# Patient Record
Sex: Female | Born: 2000 | Race: White | Hispanic: No | Marital: Single | Smoking: Former smoker
Health system: Southern US, Community
[De-identification: ages and names within clinical notes are randomized; demographics above are authoritative.]

## PROBLEM LIST (undated history)

## (undated) DIAGNOSIS — F419 Anxiety disorder, unspecified: Secondary | ICD-10-CM

## (undated) HISTORY — PX: BACK SURGERY: SHX140

## (undated) HISTORY — PX: HIP SURGERY: SHX245

## (undated) HISTORY — PX: HAND SURGERY: SHX662

## (undated) HISTORY — PX: JOINT REPLACEMENT: SHX530

## (undated) HISTORY — PX: TONSILLECTOMY: SUR1361

---
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
## 2018-03-23 ENCOUNTER — Other Ambulatory Visit: Payer: Self-pay

## 2018-03-23 ENCOUNTER — Emergency Department
Admission: EM | Admit: 2018-03-23 | Discharge: 2018-03-23 | Disposition: A | Discharge status: Home / Self Care | Payer: BLUE CROSS/BLUE SHIELD | Attending: Emergency Medicine | Admitting: Emergency Medicine

## 2018-03-23 ENCOUNTER — Emergency Department: Payer: BLUE CROSS/BLUE SHIELD

## 2018-03-23 DIAGNOSIS — B349 Viral infection, unspecified: Secondary | ICD-10-CM | POA: Diagnosis not present

## 2018-03-23 DIAGNOSIS — Z79899 Other long term (current) drug therapy: Secondary | ICD-10-CM | POA: Diagnosis not present

## 2018-03-23 DIAGNOSIS — J029 Acute pharyngitis, unspecified: Secondary | ICD-10-CM

## 2018-03-23 DIAGNOSIS — J028 Acute pharyngitis due to other specified organisms: Secondary | ICD-10-CM | POA: Insufficient documentation

## 2018-03-23 DIAGNOSIS — F17210 Nicotine dependence, cigarettes, uncomplicated: Secondary | ICD-10-CM | POA: Insufficient documentation

## 2018-03-23 DIAGNOSIS — B9789 Other viral agents as the cause of diseases classified elsewhere: Secondary | ICD-10-CM | POA: Diagnosis not present

## 2018-03-23 LAB — BASIC METABOLIC PANEL
Anion gap: 8 (ref 5–15)
BUN: 8 mg/dL (ref 6–20)
CHLORIDE: 106 mmol/L (ref 98–111)
CO2: 22 mmol/L (ref 22–32)
CREATININE: 0.76 mg/dL (ref 0.44–1.00)
Calcium: 9.1 mg/dL (ref 8.9–10.3)
GFR calc Af Amer: >60 mL/min (ref >60)
GFR calc non Af Amer: >60 mL/min (ref >60)
Glucose, Bld: 115 mg/dL — ABNORMAL HIGH (ref 70–99)
Potassium: 3.7 mmol/L (ref 3.5–5.1)
Sodium: 136 mmol/L (ref 135–145)

## 2018-03-23 LAB — CBC WITH DIFFERENTIAL/PLATELET
Abs Immature Granulocytes: 0.08 10*3/uL — ABNORMAL HIGH (ref 0.00–0.07)
Basophils Absolute: 0 10*3/uL (ref 0.0–0.1)
Basophils Relative: 0 %
EOS ABS: 0.1 10*3/uL (ref 0.0–0.5)
EOS PCT: 1 %
HEMATOCRIT: 37.5 % (ref 36.0–46.0)
HEMOGLOBIN: 12.2 g/dL (ref 12.0–15.0)
Immature Granulocytes: 1 %
LYMPHS ABS: 1.2 10*3/uL (ref 0.7–4.0)
Lymphocytes Relative: 9 %
MCH: 27.2 pg (ref 26.0–34.0)
MCHC: 32.5 g/dL (ref 30.0–36.0)
MCV: 83.7 fL (ref 80.0–100.0)
MONO ABS: 0.8 10*3/uL (ref 0.1–1.0)
Monocytes Relative: 6 %
Neutro Abs: 11.4 10*3/uL — ABNORMAL HIGH (ref 1.7–7.7)
Neutrophils Relative %: 83 %
Platelets: 336 10*3/uL (ref 150–400)
RBC: 4.48 MIL/uL (ref 3.87–5.11)
RDW: 13.2 % (ref 11.5–15.5)
WBC: 13.5 10*3/uL — AB (ref 4.0–10.5)
nRBC: 0 % (ref 0.0–0.2)

## 2018-03-23 LAB — INFLUENZA PANEL BY PCR (TYPE A & B)
INFLAPCR: NEGATIVE
INFLBPCR: NEGATIVE

## 2018-03-23 LAB — GROUP A STREP BY PCR: Group A Strep by PCR: NOT DETECTED

## 2018-03-23 LAB — PROCALCITONIN: Procalcitonin: 0.12 ng/mL

## 2018-03-23 LAB — LACTIC ACID, PLASMA: Lactic Acid, Venous: 1.4 mmol/L (ref 0.5–1.9)

## 2018-03-23 MED ORDER — SODIUM CHLORIDE 0.9 % IV BOLUS
1000.0000 mL | Freq: Once | INTRAVENOUS | Status: AC
  Administered 2018-03-23: 1000 mL via INTRAVENOUS

## 2018-03-23 MED ORDER — DEXAMETHASONE SODIUM PHOSPHATE 10 MG/ML IJ SOLN
10.0000 mg | Freq: Once | INTRAMUSCULAR | Status: AC
  Administered 2018-03-23: 10 mg via INTRAVENOUS
  Filled 2018-03-23: qty 1

## 2018-03-23 MED ORDER — IBUPROFEN 800 MG PO TABS
ORAL_TABLET | ORAL | Status: AC
  Filled 2018-03-23: qty 1

## 2018-03-23 MED ORDER — IBUPROFEN 800 MG PO TABS
800.0000 mg | ORAL_TABLET | Freq: Once | ORAL | Status: AC
  Administered 2018-03-23: 800 mg via ORAL

## 2018-03-23 NOTE — ED Notes (Signed)
Patient transported to X-ray 

## 2018-03-23 NOTE — ED Provider Notes (Signed)
[facility] Emergency Department Provider Note  ____________________________________________   First MD Initiated Contact with Patient 03/23/18 [phone]     (approximate)  I have reviewed the triage vital signs and the nursing notes.   HISTORY  Chief Complaint Sore Throat    HPI Tara Daugherty is a 18 y.o. female with no chronic medical issues and is currently a Consulting civil engineer at [employer].  She presents for evaluation of 24 to 48 hours of gradually worsening sore throat, mild headache, generalized aches throughout her body, subjective fever and chills, and some nausea.  She has had one episode of vomiting.  She has had less appetite than usual but no abdominal pain.  The symptom that is bothering her the most is her sore throat but she is not having any difficulty swallowing and does not have a hoarse voice.  She has had her tonsils removed years ago.  Nothing in particular makes her symptoms better or worse.  She has no neck pain or stiffness, no chest pain, no shortness of breath.  Past Medical History:  Diagnosis Date  . Anxiety     There are no active problems to display for this patient.   Past Surgical History:  Procedure Laterality Date  . BACK SURGERY     back injections  . HIP SURGERY    . JOINT REPLACEMENT    . TONSILLECTOMY      Prior to Admission medications   Medication Sig Start Date End Date Taking? Authorizing Provider  albuterol (PROVENTIL HFA;VENTOLIN HFA) 108 (90 Base) MCG/ACT inhaler Inhale 2 puffs into the lungs every 6 (six) hours as needed for wheezing or shortness of breath. 10/24/17   Menshew, Charlesetta Ivory, PA-C  ALPRAZolam (XANAX) 0.5 MG tablet Take 1 tablet (0.5 mg total) by mouth at bedtime as needed for anxiety. 11/16/17   Irean Hong, MD  azithromycin (ZITHROMAX Z-PAK) 250 MG tablet Take 2 tablets (500 mg) on  Day 1,  followed by 1 tablet (250 mg) once daily on Days 2 through 5. 10/24/17   Menshew, Charlesetta Ivory, PA-C  benzonatate  (TESSALON PERLES) 100 MG capsule Take 1-2 tabs TID prn cough 10/24/17   Menshew, Charlesetta Ivory, PA-C  fluticasone (FLONASE) 50 MCG/ACT nasal spray Place 2 sprays into both nostrils daily. 10/24/17   Menshew, Charlesetta Ivory, PA-C    Allergies Patient has no known allergies.  No family history on file.  Social History Social History   Tobacco Use  . Smoking status: Current Some Day Smoker    Types: E-cigarettes  . Smokeless tobacco: Never Used  Substance Use Topics  . Alcohol use: Not Currently    Frequency: Never  . Drug use: Yes    Types: Marijuana    Comment: Rarely    Review of Systems Constitutional: +fever/chills Eyes: No visual changes. ENT: +sore throat. Cardiovascular: Denies chest pain. Respiratory: Occasional cough.  Denies shortness of breath. Gastrointestinal: No abdominal pain.  Some nausea, one episode of vomiting.  No diarrhea.  No constipation. Genitourinary: Negative for dysuria. Musculoskeletal: Generalized body aches.  Negative for neck pain.  Negative for back pain. Integumentary: Negative for rash. Neurological: Negative for headaches, focal weakness or numbness.   ____________________________________________   PHYSICAL EXAM:  VITAL SIGNS: ED Triage Vitals [03/23/18 0053]  Enc Vitals Group     BP (!) 94/54     Pulse Rate (!) 135     Resp 20     Temp 99.3 F (37.4 C)  Temp Source Oral     SpO2 100 %     Weight 61.2 kg (135 lb)     Height 1.676 m (5\' 6" )     Head Circumference      Peak Flow      Pain Score 8     Pain Loc      Pain Edu?      Excl. in GC?     Constitutional: Alert and oriented.  Appears uncomfortable as of from a viral illness but is not in acute distress. Eyes: Conjunctivae are normal.  Head: Atraumatic. Nose: +congestion/rhinnorhea. Mouth/Throat: Mucous membranes are moist.  Oropharynx non-erythematous.  Tonsils are surgically absent.  Airway is widely patent with no evidence or concern for oropharyngeal abscess  in the area where the tonsils would be.  No evidence of or odontogenic infection Neck: No stridor.  No meningeal signs.  She is able to flex and extend as well as turn her head side to side without any difficulty. Cardiovascular: Tachycardia, regular rhythm. Good peripheral circulation. Grossly normal heart sounds. Respiratory: Normal respiratory effort.  No retractions. Lungs CTAB. Gastrointestinal: Soft and nontender. No distention.  Musculoskeletal: No lower extremity tenderness nor edema. No gross deformities of extremities. Neurologic:  Normal speech and language. No gross focal neurologic deficits are appreciated.  Skin:  Skin is warm, dry and intact. No rash noted. Psychiatric: Mood and affect are normal. Speech and behavior are normal.  ____________________________________________   LABS (all labs ordered are listed, but only abnormal results are displayed)  Labs Reviewed  CBC WITH DIFFERENTIAL/PLATELET - Abnormal; Notable for the following components:      Result Value   WBC 13.5 (*)    Neutro Abs 11.4 (*)    Abs Immature Granulocytes 0.08 (*)    All other components within normal limits  BASIC METABOLIC PANEL - Abnormal; Notable for the following components:   Glucose, Bld 115 (*)    All other components within normal limits  GROUP A STREP BY PCR  INFLUENZA PANEL BY PCR (TYPE A & B)  LACTIC ACID, PLASMA  PROCALCITONIN  LACTIC ACID, PLASMA   ____________________________________________  EKG  None - EKG not ordered by ED physician ____________________________________________  RADIOLOGY   ED MD interpretation: No evidence of abnormality on soft tissue neck x-rays  Official radiology report(s): Dg Neck Soft Tissue  Result Date: 03/23/2018 CLINICAL DATA:  18 year old female with sore throat, headache, body aches and chills since this morning. EXAM: NECK SOFT TISSUES - 1+ VIEW COMPARISON:  None. FINDINGS: There is no evidence of retropharyngeal soft tissue swelling  or epiglottic enlargement. The cervical airway is unremarkable and no radio-opaque foreign body identified. IMPRESSION: Negative. Electronically Signed   By: Trudie Reed M.D.   On: 03/23/2018 05:19    ____________________________________________   PROCEDURES  Critical Care performed: No   Procedure(s) performed:   Procedures   ____________________________________________   INITIAL IMPRESSION / ASSESSMENT AND PLAN / ED COURSE  As part of my medical decision making, I reviewed the following data within the electronic MEDICAL RECORD NUMBER Nursing notes reviewed and incorporated, Labs reviewed , Old chart reviewed and Radiograph reviewed     Signs and symptoms are most consistent with viral illness such as influenza.  Differential diagnosis also includes strep pharyngitis, nonspecific viral illness, less likely epiglottitis, retropharyngeal infection/abscess, peritonsillar abscess.  The patient does not have a "hot potato" voice or any difficulty swallowing or tolerating her secretions.  Physical exam is reassuring.  Given her tachycardia upon  arrival I will give her 1 L normal saline IV bolus and check some basic labs but I suspect she will be appropriate for discharge and outpatient follow-up.  I will also check radiographs of her neck to make sure there is no evidence of swelling of her retropharyngeal tissues.  She agrees with the plan.  I am giving Decadron 10 mg IV to help with the pain and swelling.  Clinical Course as of Mar 23 632  Tue Mar 23, 2018  0533 WBC(!): 13.5 [CF]  0603 Other than a mild leukocytosis, the patient's lab work is all reassuring within normal limits.   [CF]  [phone] Work-up was reassuring, neck radiographs unremarkable, lactic within normal limits, no sign of sepsis, normal basic metabolic panel.  The patient feels better after a liter of fluids and her heart rate is down in the 70s.  She will be discharged with my usual customary return precautions.   [CF]      Clinical Course User Index [CF] Loleta RoseForbach, Sheniah Supak, MD    ____________________________________________  FINAL CLINICAL IMPRESSION(S) / ED DIAGNOSES  Final diagnoses:  Viral syndrome  Viral pharyngitis     MEDICATIONS GIVEN DURING THIS VISIT:  Medications  ibuprofen (ADVIL,MOTRIN) 800 MG tablet (  Not Given 03/23/18 0336)  dexamethasone (DECADRON) injection 10 mg (has no administration in time range)  ibuprofen (ADVIL,MOTRIN) tablet 800 mg (800 mg Oral Given 03/23/18 0059)  sodium chloride 0.9 % bolus 1,000 mL (1,000 mLs Intravenous New Bag/Given 03/23/18 [id number])     ED Discharge Orders    None       Note:  This document was prepared using Dragon voice recognition software and may include unintentional dictation errors.   Loleta RoseForbach, Serena Petterson, MD 03/23/18 [phone]

## 2018-03-23 NOTE — ED Notes (Signed)
Upon entering room, pt was soundly asleep. No distress at this time. Will continue to monitor.

## 2018-03-23 NOTE — ED Triage Notes (Signed)
Pt in with co sore throat, headache, body aches, chills, and chills.

## 2018-03-23 NOTE — Discharge Instructions (Signed)

## 2019-05-26 ENCOUNTER — Other Ambulatory Visit: Payer: Self-pay

## 2019-05-26 ENCOUNTER — Ambulatory Visit
Admission: EM | Admit: 2019-05-26 | Discharge: 2019-05-26 | Disposition: A | Discharge status: Home / Self Care | Payer: BC Managed Care – PPO | Attending: Emergency Medicine | Admitting: Emergency Medicine

## 2019-05-26 DIAGNOSIS — R319 Hematuria, unspecified: Secondary | ICD-10-CM | POA: Insufficient documentation

## 2019-05-26 DIAGNOSIS — N390 Urinary tract infection, site not specified: Secondary | ICD-10-CM

## 2019-05-26 DIAGNOSIS — N898 Other specified noninflammatory disorders of vagina: Secondary | ICD-10-CM | POA: Insufficient documentation

## 2019-05-26 LAB — POCT URINALYSIS DIP (MANUAL ENTRY)
Bilirubin, UA: NEGATIVE
Glucose, UA: NEGATIVE mg/dL
Ketones, POC UA: NEGATIVE mg/dL
Nitrite, UA: POSITIVE — AB
Protein Ur, POC: 100 mg/dL — AB
Spec Grav, UA: >=1.03 — AB (ref 1.010–1.025)
Urobilinogen, UA: 0.2 E.U./dL
pH, UA: 6 (ref 5.0–8.0)

## 2019-05-26 MED ORDER — CEPHALEXIN 500 MG PO CAPS: 500.0000 mg | ORAL_CAPSULE | Freq: Three times a day (TID) | ORAL | 0 refills | Status: AC

## 2019-05-26 MED ORDER — FLUCONAZOLE 150 MG PO TABS: 150.0000 mg | ORAL_TABLET | Freq: Every day | ORAL | 0 refills | Status: DC

## 2019-05-26 NOTE — Discharge Instructions (Signed)
Take the antibiotic cephalexin and the yeast infection  medication Diflucan as directed.    Your vaginal swab test are pending.  We will call you if your test results are positive requiring additional treatment.    Follow-up with your primary care provider or OB/GYN if your symptoms are not improving.

## 2019-05-26 NOTE — ED Provider Notes (Signed)
Tara Daugherty    CSN: [id number] Arrival date & time: 05/26/19  1458      History   Chief Complaint Chief Complaint  Patient presents with  . Vaginitis  . Dysuria    HPI Tara Daugherty is a 19 y.o. female.   Patient presents with dysuria, urinary frequency, urgency x 4 days.  She also reports small amount of thick white vaginal discharge, vaginal irritation, itching x2 days.  Patient reports she has a new sexual partner but they have use condoms.  She would like to be checked for STDs but declines blood work for HIV/RPR.  She denies rash, lesions, fever, chills, abdominal pain, back pain, pelvic pain, or other symptoms.  No treatments attempted at home.    The history is provided by the patient.    Past Medical History:  Diagnosis Date  . Anxiety     There are no problems to display for this patient.   Past Surgical History:  Procedure Laterality Date  . BACK SURGERY     back injections  . HIP SURGERY    . JOINT REPLACEMENT    . TONSILLECTOMY      OB History   No obstetric history on file.      Home Medications    Prior to Admission medications   Medication Sig Start Date End Date Taking? Authorizing Provider  ALPRAZolam Duanne Moron) 0.5 MG tablet Take 1 tablet (0.5 mg total) by mouth at bedtime as needed for anxiety. 11/16/17  Yes Paulette Blanch, MD  desvenlafaxine (PRISTIQ) 100 MG 24 hr tablet TK 1 T PO D 02/17/18  Yes [provider]  drospirenone-ethinyl estradiol (YAZ) 3-0.02 MG tablet Take by mouth. 02/18/19  Yes [provider]  gabapentin (NEURONTIN) 300 MG capsule Take 300 mg by mouth at bedtime.    Yes [provider]  hydrOXYzine (ATARAX/VISTARIL) 50 MG tablet TAKE 1 TABLET BY MOUTH 3 TO 4 TIMES DAILY AS NEEDED 02/22/19  Yes [provider]  spironolactone (ALDACTONE) 25 MG tablet  05/02/19  Yes [provider]  traZODone (DESYREL) 50 MG tablet  05/02/19  Yes [provider]  albuterol (PROVENTIL  HFA;VENTOLIN HFA) 108 (90 Base) MCG/ACT inhaler Inhale 2 puffs into the lungs every 6 (six) hours as needed for wheezing or shortness of breath. 10/24/17   Menshew, Dannielle Karvonen, PA-C  azithromycin (ZITHROMAX Z-PAK) 250 MG tablet Take 2 tablets (500 mg) on  Day 1,  followed by 1 tablet (250 mg) once daily on Days 2 through 5. 10/24/17   Menshew, Dannielle Karvonen, PA-C  benzonatate (TESSALON PERLES) 100 MG capsule Take 1-2 tabs TID prn cough 10/24/17   Menshew, Dannielle Karvonen, PA-C  cephALEXin (KEFLEX) 500 MG capsule Take 1 capsule (500 mg total) by mouth 3 (three) times daily for 5 days. 05/26/19 05/31/19  Sharion Balloon, NP  fluconazole (DIFLUCAN) 150 MG tablet Take 1 tablet (150 mg total) by mouth daily. Take one tablet today.  May repeat in 3 days. 05/26/19   Sharion Balloon, NP  fluticasone (FLONASE) 50 MCG/ACT nasal spray Place 2 sprays into both nostrils daily. 10/24/17   Menshew, Dannielle Karvonen, PA-C    Family History Family History  Problem Relation Age of Onset  . Healthy Mother   . Healthy Father     Social History Social History   Tobacco Use  . Smoking status: Current Some Day Smoker    Types: E-cigarettes  . Smokeless tobacco: Never Used  Substance  Use Topics  . Alcohol use: Not Currently  . Drug use: Yes    Types: Marijuana    Comment: Rarely     Allergies   Patient has no known allergies.   Review of Systems Review of Systems  Constitutional: Negative for chills and fever.  HENT: Negative for ear pain and sore throat.   Eyes: Negative for pain and visual disturbance.  Respiratory: Negative for cough and shortness of breath.   Cardiovascular: Negative for chest pain and palpitations.  Gastrointestinal: Negative for abdominal pain, diarrhea, nausea and vomiting.  Genitourinary: Positive for dysuria, frequency and vaginal discharge. Negative for flank pain, hematuria and pelvic pain.  Musculoskeletal: Negative for arthralgias and back pain.  Skin: Negative for color  change and rash.  Neurological: Negative for seizures and syncope.  All other systems reviewed and are negative.    Physical Exam Triage Vital Signs ED Triage Vitals  Enc Vitals Group     BP      Pulse      Resp      Temp      Temp src      SpO2      Weight      Height      Head Circumference      Peak Flow      Pain Score      Pain Loc      Pain Edu?      Excl. in GC?    No data found.  Updated Vital Signs BP 110/75 (BP Location: Left Arm)   Pulse 93   Temp 97.9 F (36.6 C) (Oral)   Resp 18   Ht 5\' 7"  (1.702 m)   Wt 140 lb (63.5 kg)   LMP 05/13/2019   SpO2 98%   BMI 21.93 kg/m   Visual Acuity Right Eye Distance:   Left Eye Distance:   Bilateral Distance:    Right Eye Near:   Left Eye Near:    Bilateral Near:     Physical Exam Vitals and nursing note reviewed.  Constitutional:      General: She is not in acute distress.    Appearance: She is well-developed. She is not ill-appearing.  HENT:     Head: Normocephalic and atraumatic.     Mouth/Throat:     Mouth: Mucous membranes are moist.     Pharynx: Oropharynx is clear.  Eyes:     Conjunctiva/sclera: Conjunctivae normal.  Cardiovascular:     Rate and Rhythm: Normal rate and regular rhythm.     Heart sounds: No murmur.  Pulmonary:     Effort: Pulmonary effort is normal. No respiratory distress.     Breath sounds: Normal breath sounds.  Abdominal:     General: Bowel sounds are normal.     Palpations: Abdomen is soft.     Tenderness: There is no abdominal tenderness. There is no right CVA tenderness, left CVA tenderness, guarding or rebound.  Musculoskeletal:     Cervical back: Neck supple.  Skin:    General: Skin is warm and dry.     Findings: No rash.  Neurological:     General: No focal deficit present.     Mental Status: She is alert and oriented to person, place, and time.     Gait: Gait normal.  Psychiatric:        Mood and Affect: Mood normal.        Behavior: Behavior normal.       UC Treatments /  Results  Labs (all labs ordered are listed, but only abnormal results are displayed) Labs Reviewed  POCT URINALYSIS DIP (MANUAL ENTRY) - Abnormal; Notable for the following components:      Result Value   Clarity, UA cloudy (*)    Spec Grav, UA >=1.030 (*)    Blood, UA large (*)    Protein Ur, POC =100 (*)    Nitrite, UA Positive (*)    Leukocytes, UA Large (3+) (*)    All other components within normal limits  URINE CULTURE  CERVICOVAGINAL ANCILLARY ONLY    EKG   Radiology No results found.  Procedures Procedures (including critical care time)  Medications Ordered in UC Medications - No data to display  Initial Impression / Assessment and Plan / UC Course  I have reviewed the triage vital signs and the nursing notes.  Pertinent labs & imaging results that were available during my care of the patient were reviewed by me and considered in my medical decision making (see chart for details).   UTI, vaginal discharge.  Urine culture pending.  Vaginal self swab obtained by patient.  Patient declines blood testing for HIV/RPR.  Treating with Keflex and Diflucan.  Discussed with patient that she may need additional treatment if her tests come back positive.  Instructed her to follow-up with her PCP or OB/GYN if her symptoms persist.  Patient agrees to plan of care.     Final Clinical Impressions(s) / UC Diagnoses   Final diagnoses:  Urinary tract infection with hematuria, site unspecified  Vaginal discharge     Discharge Instructions     Take the antibiotic cephalexin and the yeast infection  medication Diflucan as directed.    Your vaginal swab test are pending.  We will call you if your test results are positive requiring additional treatment.    Follow-up with your primary care provider or OB/GYN if your symptoms are not improving.          ED Prescriptions    Medication Sig Dispense Auth. Provider   cephALEXin (KEFLEX) 500 MG capsule  Take 1 capsule (500 mg total) by mouth 3 (three) times daily for 5 days. 15 capsule Mickie Bail, NP   fluconazole (DIFLUCAN) 150 MG tablet Take 1 tablet (150 mg total) by mouth daily. Take one tablet today.  May repeat in 3 days. 2 tablet Mickie Bail, NP     PDMP not reviewed this encounter.   Mickie Bail, NP 05/26/19 1557

## 2019-05-26 NOTE — ED Triage Notes (Signed)
Patient complains of dysuria with urinary frequency, urgency x Sunday.  States that she has noticed vaginal pain and irritation since Tuesday. Patient states that she does have a new sexual partner and would like to be checked.

## 2019-05-27 LAB — CERVICOVAGINAL ANCILLARY ONLY
Bacterial Vaginitis (gardnerella): NEGATIVE
Candida Glabrata: NEGATIVE
Candida Vaginitis: NEGATIVE
Chlamydia: NEGATIVE
Comment: NEGATIVE
Comment: NEGATIVE
Comment: NEGATIVE
Comment: NEGATIVE
Comment: NEGATIVE
Comment: NORMAL
Neisseria Gonorrhea: NEGATIVE
Trichomonas: NEGATIVE

## 2019-05-28 LAB — URINE CULTURE: Culture: >=100000 — AB

## 2019-10-08 ENCOUNTER — Other Ambulatory Visit: Payer: Self-pay

## 2019-10-08 ENCOUNTER — Encounter (HOSPITAL_COMMUNITY): Payer: Self-pay

## 2019-10-08 ENCOUNTER — Ambulatory Visit (HOSPITAL_COMMUNITY)
Admission: EM | Admit: 2019-10-08 | Discharge: 2019-10-08 | Disposition: A | Discharge status: Home / Self Care | Payer: BC Managed Care – PPO | Attending: Urgent Care | Admitting: Urgent Care

## 2019-10-08 DIAGNOSIS — Z3202 Encounter for pregnancy test, result negative: Secondary | ICD-10-CM | POA: Diagnosis not present

## 2019-10-08 DIAGNOSIS — N912 Amenorrhea, unspecified: Secondary | ICD-10-CM

## 2019-10-08 LAB — POC URINE PREG, ED: Preg Test, Ur: NEGATIVE

## 2019-10-08 NOTE — ED Triage Notes (Signed)
Pt has missed two periods and would like to have a pregnancy test done to make sure she is not pregnant. Pt has taking two home pregnancy test an they both came back negative.

## 2019-10-08 NOTE — ED Provider Notes (Signed)
[facility]   MRN: [id number] DOB: [birth year]  Subjective:   Tara Daugherty is a 19 y.o. female presenting for 2 missed periods.  Denies fever, nausea, vomiting, belly or pelvic pain.  Patient took 2 home pregnancy tests and both were negative.  She wanted to come in and have [place] do want to make sure was accurate.  No current facility-administered medications for this encounter.  Current Outpatient Medications:  .  albuterol (PROVENTIL HFA;VENTOLIN HFA) 108 (90 Base) MCG/ACT inhaler, Inhale 2 puffs into the lungs every 6 (six) hours as needed for wheezing or shortness of breath., Disp: 1 Inhaler, Rfl: 0 .  ALPRAZolam (XANAX) 0.5 MG tablet, Take 1 tablet (0.5 mg total) by mouth at bedtime as needed for anxiety., Disp: 10 tablet, Rfl: 0 .  azithromycin (ZITHROMAX Z-PAK) 250 MG tablet, Take 2 tablets (500 mg) on  Day 1,  followed by 1 tablet (250 mg) once daily on Days 2 through 5., Disp: 6 each, Rfl: 0 .  benzonatate (TESSALON PERLES) 100 MG capsule, Take 1-2 tabs TID prn cough, Disp: 30 capsule, Rfl: 0 .  desvenlafaxine (PRISTIQ) 100 MG 24 hr tablet, TK 1 T PO D, Disp: , Rfl:  .  drospirenone-ethinyl estradiol (YAZ) 3-0.02 MG tablet, Take by mouth., Disp: , Rfl:  .  fluconazole (DIFLUCAN) 150 MG tablet, Take 1 tablet (150 mg total) by mouth daily. Take one tablet today.  May repeat in 3 days., Disp: 2 tablet, Rfl: 0 .  fluticasone (FLONASE) 50 MCG/ACT nasal spray, Place 2 sprays into both nostrils daily., Disp: 16 g, Rfl: 0 .  gabapentin (NEURONTIN) 300 MG capsule, Take 300 mg by mouth at bedtime. , Disp: , Rfl:  .  hydrOXYzine (ATARAX/VISTARIL) 50 MG tablet, TAKE 1 TABLET BY MOUTH 3 TO 4 TIMES DAILY AS NEEDED, Disp: , Rfl:  .  spironolactone (ALDACTONE) 25 MG tablet, , Disp: , Rfl:  .  traZODone (DESYREL) 50 MG tablet, , Disp: , Rfl:    No Known Allergies  Past Medical History:  Diagnosis Date  . Anxiety      Past Surgical History:  Procedure Laterality Date  . BACK  SURGERY     back injections  . HIP SURGERY    . JOINT REPLACEMENT    . TONSILLECTOMY      Family History  Problem Relation Age of Onset  . Healthy Mother   . Healthy Father     Social History   Tobacco Use  . Smoking status: Current Some Day Smoker    Types: E-cigarettes  . Smokeless tobacco: Never Used  Vaping Use  . Vaping Use: Never used  Substance Use Topics  . Alcohol use: Not Currently  . Drug use: Yes    Types: Marijuana    Comment: Rarely    ROS   Objective:   Vitals: BP 137/83 (BP Location: Right Arm)   Pulse 88   Temp 98.9 F (37.2 C) (Oral)   Resp 16   SpO2 99%   Physical Exam Constitutional:      General: She is not in acute distress.    Appearance: Normal appearance. She is well-developed. She is not ill-appearing.  HENT:     Head: Normocephalic and atraumatic.     Nose: Nose normal.     Mouth/Throat:     Mouth: Mucous membranes are moist.     Pharynx: Oropharynx is clear.  Eyes:     General: No scleral icterus.       Right eye:  No discharge.        Left eye: No discharge.     Extraocular Movements: Extraocular movements intact.     Conjunctiva/sclera: Conjunctivae normal.     Pupils: Pupils are equal, round, and reactive to light.  Cardiovascular:     Rate and Rhythm: Normal rate.  Pulmonary:     Effort: Pulmonary effort is normal.  Skin:    General: Skin is warm and dry.  Neurological:     General: No focal deficit present.     Mental Status: She is alert and oriented to person, place, and time.  Psychiatric:        Mood and Affect: Mood normal.        Behavior: Behavior normal.        Thought Content: Thought content normal.        Judgment: Judgment normal.     Results for orders placed or performed during the hospital encounter of 10/08/19 (from the past 24 hour(s))  POC urine preg, ED (not at [facility])     Status: None   Collection Time: 10/08/19  5:58 PM  Result Value Ref Range   Preg Test, Ur NEGATIVE NEGATIVE     Assessment and Plan :   PDMP not reviewed this encounter.  1. Amenorrhea    Patient is on OCP, Yaz and I suspect this is the primary source of her symptoms.  Recommended a recheck with her gynecologist.  Patient is otherwise asymptomatic, felt very reassured that our pregnancy test was negative.     Wallis Bamberg, [place] 10/08/19 1828

## 2019-10-27 ENCOUNTER — Ambulatory Visit
Admission: EM | Admit: 2019-10-27 | Discharge: 2019-10-27 | Disposition: A | Discharge status: Home / Self Care | Payer: BC Managed Care – PPO | Attending: Emergency Medicine | Admitting: Emergency Medicine

## 2019-10-27 ENCOUNTER — Ambulatory Visit
Admission: RE | Admit: 2019-10-27 | Discharge: 2019-10-27 | Disposition: A | Discharge status: Home / Self Care | Payer: BC Managed Care – PPO | Source: Ambulatory Visit | Attending: Emergency Medicine | Admitting: Emergency Medicine

## 2019-10-27 ENCOUNTER — Ambulatory Visit
Admission: RE | Admit: 2019-10-27 | Discharge: 2019-10-27 | Disposition: A | Discharge status: Home / Self Care | Payer: BC Managed Care – PPO | Attending: Emergency Medicine | Admitting: Emergency Medicine

## 2019-10-27 ENCOUNTER — Other Ambulatory Visit: Payer: Self-pay

## 2019-10-27 DIAGNOSIS — R058 Other specified cough: Secondary | ICD-10-CM

## 2019-10-27 DIAGNOSIS — J209 Acute bronchitis, unspecified: Secondary | ICD-10-CM | POA: Diagnosis not present

## 2019-10-27 DIAGNOSIS — R05 Cough: Secondary | ICD-10-CM

## 2019-10-27 MED ORDER — AMOXICILLIN 875 MG PO TABS: 875.0000 mg | ORAL_TABLET | Freq: Two times a day (BID) | ORAL | 0 refills | Status: AC

## 2019-10-27 MED ORDER — ALBUTEROL SULFATE HFA 108 (90 BASE) MCG/ACT IN AERS: 2.0000 | INHALATION_SPRAY | RESPIRATORY_TRACT | 0 refills | Status: DC | PRN

## 2019-10-27 NOTE — ED Triage Notes (Addendum)
Patient c/o cough, body aches, congestion, and fever x4 days. Patient has a negative rapid COVID test day one of symptom onset. Reports this feels like when she has had bronchitis in the past.   Patient is not vaccinated; patient agreeable to PCR COVID testing.

## 2019-10-27 NOTE — Discharge Instructions (Addendum)
Go to [facility] for your chest xray.  I will call you with the results afterward.    Use the albuterol inhaler and take the amoxicillin as directed.  Follow-up with your primary care provider or return here if your symptoms are not improving.

## 2019-10-27 NOTE — ED Provider Notes (Signed)
Tara Daugherty    CSN: [id number] Arrival date & time: 10/27/19  1443      History   Chief Complaint Chief Complaint  Patient presents with  . Cough  . Fever  . Generalized Body Aches  . Congestion    HPI Tara Daugherty is a 19 y.o. female.   Patient presents with fever, cough, body aches, nasal congestion x4 days.  T-max 101.  Her cough is productive of unknown color phlegm.  She denies earache, sore throat, vomiting, diarrhea, rash, or other symptoms.  She has taken Tylenol for her symptoms.  She had a negative rapid COVID test earlier this week.  She denies current pregnancy or breastfeeding.    The history is provided by the patient.    Past Medical History:  Diagnosis Date  . Anxiety     There are no problems to display for this patient.   Past Surgical History:  Procedure Laterality Date  . BACK SURGERY     back injections  . HIP SURGERY    . JOINT REPLACEMENT    . TONSILLECTOMY      OB History   No obstetric history on file.      Home Medications    Prior to Admission medications   Medication Sig Start Date End Date Taking? Authorizing Provider  albuterol (VENTOLIN HFA) 108 (90 Base) MCG/ACT inhaler Inhale 2 puffs into the lungs every 4 (four) hours as needed for wheezing or shortness of breath. 10/27/19   Mickie Bail, NP  ALPRAZolam Prudy Feeler) 0.5 MG tablet Take 1 tablet (0.5 mg total) by mouth at bedtime as needed for anxiety. 11/16/17   Irean Hong, MD  amoxicillin (AMOXIL) 875 MG tablet Take 1 tablet (875 mg total) by mouth 2 (two) times daily for 7 days. 10/27/19 11/03/19  Mickie Bail, NP  azithromycin (ZITHROMAX Z-PAK) 250 MG tablet Take 2 tablets (500 mg) on  Day 1,  followed by 1 tablet (250 mg) once daily on Days 2 through 5. 10/24/17   Menshew, Charlesetta Ivory, PA-C  benzonatate (TESSALON PERLES) 100 MG capsule Take 1-2 tabs TID prn cough 10/24/17   Menshew, Charlesetta Ivory, PA-C  desvenlafaxine (PRISTIQ) 100 MG 24 hr tablet TK 1 T PO D  02/17/18   [provider]  drospirenone-ethinyl estradiol (YAZ) 3-0.02 MG tablet Take by mouth. 02/18/19   [provider]  fluconazole (DIFLUCAN) 150 MG tablet Take 1 tablet (150 mg total) by mouth daily. Take one tablet today.  May repeat in 3 days. 05/26/19   Mickie Bail, NP  fluticasone (FLONASE) 50 MCG/ACT nasal spray Place 2 sprays into both nostrils daily. 10/24/17   Menshew, Charlesetta Ivory, PA-C  gabapentin (NEURONTIN) 300 MG capsule Take 300 mg by mouth at bedtime.     [provider]  hydrOXYzine (ATARAX/VISTARIL) 50 MG tablet TAKE 1 TABLET BY MOUTH 3 TO 4 TIMES DAILY AS NEEDED 02/22/19   [provider]  spironolactone (ALDACTONE) 25 MG tablet  05/02/19   [provider]  traZODone (DESYREL) 50 MG tablet  05/02/19   [provider]    Family History Family History  Problem Relation Age of Onset  . Healthy Mother   . Healthy Father     Social History Social History   Tobacco Use  . Smoking status: Current Some Day Smoker    Types: E-cigarettes  . Smokeless tobacco: Never Used  Vaping Use  . Vaping Use: Never used  Substance Use  Topics  . Alcohol use: Not Currently  . Drug use: Yes    Types: Marijuana    Comment: Rarely     Allergies   Patient has no known allergies.   Review of Systems Review of Systems  Constitutional: Positive for fever. Negative for chills.  HENT: Positive for congestion. Negative for ear pain and sore throat.   Eyes: Negative for pain and visual disturbance.  Respiratory: Positive for cough. Negative for shortness of breath.   Cardiovascular: Negative for chest pain and palpitations.  Gastrointestinal: Negative for abdominal pain, diarrhea and vomiting.  Genitourinary: Negative for dysuria and hematuria.  Musculoskeletal: Negative for arthralgias and back pain.  Skin: Negative for color change and rash.  Neurological: Negative for seizures and syncope.  All other systems reviewed and are  negative.    Physical Exam Triage Vital Signs ED Triage Vitals  Enc Vitals Group     BP      Pulse      Resp      Temp      Temp src      SpO2      Weight      Height      Head Circumference      Peak Flow      Pain Score      Pain Loc      Pain Edu?      Excl. in GC?    No data found.  Updated Vital Signs BP 113/76   Pulse (!) 102   Temp 98.2 F (36.8 C)   Resp (!) 22   SpO2 98%   Visual Acuity Right Eye Distance:   Left Eye Distance:   Bilateral Distance:    Right Eye Near:   Left Eye Near:    Bilateral Near:     Physical Exam Vitals and nursing note reviewed.  Constitutional:      General: She is not in acute distress.    Appearance: She is well-developed.  HENT:     Head: Normocephalic and atraumatic.     Right Ear: Tympanic membrane normal.     Left Ear: Tympanic membrane normal.     Nose: Congestion present.     Mouth/Throat:     Mouth: Mucous membranes are moist.     Pharynx: Oropharynx is clear.  Eyes:     Conjunctiva/sclera: Conjunctivae normal.  Cardiovascular:     Rate and Rhythm: Normal rate and regular rhythm.     Heart sounds: No murmur heard.   Pulmonary:     Effort: Pulmonary effort is normal. No respiratory distress.     Breath sounds: Rhonchi present. No wheezing.     Comments: Scattered rhonchi throughout. Abdominal:     Palpations: Abdomen is soft.     Tenderness: There is no abdominal tenderness. There is no guarding.  Musculoskeletal:     Cervical back: Neck supple.  Skin:    General: Skin is warm and dry.     Findings: No rash.  Neurological:     General: No focal deficit present.     Mental Status: She is alert and oriented to person, place, and time.     Gait: Gait normal.  Psychiatric:        Mood and Affect: Mood normal.        Behavior: Behavior normal.      UC Treatments / Results  Labs (all labs ordered are listed, but only abnormal results are displayed) Labs Reviewed  NOVEL CORONAVIRUS, NAA  EKG   Radiology DG Chest 2 View  Result Date: 10/27/2019 CLINICAL DATA:  Productive cough EXAM: CHEST - 2 VIEW COMPARISON:  None. FINDINGS: The heart size and mediastinal contours are within normal limits. Both lungs are clear. The visualized skeletal structures are unremarkable. IMPRESSION: No active cardiopulmonary disease. Electronically Signed   By: Jonna Clark M.D.   On: 10/27/2019 17:15    Procedures Procedures (including critical care time)  Medications Ordered in UC Medications - No data to display  Initial Impression / Assessment and Plan / UC Course  I have reviewed the triage vital signs and the nursing notes.  Pertinent labs & imaging results that were available during my care of the patient were reviewed by me and considered in my medical decision making (see chart for details).   Productive cough,  Acute bronchitis. Chest x-ray negative.  Based on exam findings, treating patient with albuterol inhaler and amoxicillin.  Instructed her to follow-up with her PCP or return here if her symptoms are not improving.  PCR COVID pending.  Instructed patient to self quarantine until the test result is back.  Patient agrees to plan of care.   Final Clinical Impressions(s) / UC Diagnoses   Final diagnoses:  Productive cough  Acute bronchitis, unspecified organism     Discharge Instructions     Go to [facility] for your chest xray.  I will call you with the results afterward.    Use the albuterol inhaler and take the amoxicillin as directed.  Follow-up with your primary care provider or return here if your symptoms are not improving.          ED Prescriptions    Medication Sig Dispense Auth. Provider   amoxicillin (AMOXIL) 875 MG tablet Take 1 tablet (875 mg total) by mouth 2 (two) times daily for 7 days. 14 tablet Mickie Bail, NP   albuterol (VENTOLIN HFA) 108 (90 Base) MCG/ACT inhaler Inhale 2 puffs into the lungs every 4  (four) hours as needed for wheezing or shortness of breath. 18 g Mickie Bail, NP     PDMP not reviewed this encounter.   Mickie Bail, NP 10/27/19 1730

## 2019-10-29 LAB — NOVEL CORONAVIRUS, NAA: SARS-CoV-2, NAA: NOT DETECTED

## 2019-10-29 LAB — SARS-COV-2, NAA 2 DAY TAT

## 2019-12-23 ENCOUNTER — Ambulatory Visit
Admission: EM | Admit: 2019-12-23 | Discharge: 2019-12-23 | Disposition: A | Discharge status: Home / Self Care | Payer: BC Managed Care – PPO | Attending: Family Medicine | Admitting: Family Medicine

## 2019-12-23 ENCOUNTER — Other Ambulatory Visit: Payer: Self-pay

## 2019-12-23 ENCOUNTER — Ambulatory Visit (INDEPENDENT_AMBULATORY_CARE_PROVIDER_SITE_OTHER): Payer: BC Managed Care – PPO

## 2019-12-23 DIAGNOSIS — J209 Acute bronchitis, unspecified: Secondary | ICD-10-CM | POA: Insufficient documentation

## 2019-12-23 DIAGNOSIS — H9203 Otalgia, bilateral: Secondary | ICD-10-CM | POA: Diagnosis present

## 2019-12-23 DIAGNOSIS — Z1152 Encounter for screening for COVID-19: Secondary | ICD-10-CM | POA: Diagnosis not present

## 2019-12-23 DIAGNOSIS — R051 Acute cough: Secondary | ICD-10-CM | POA: Diagnosis not present

## 2019-12-23 DIAGNOSIS — R0602 Shortness of breath: Secondary | ICD-10-CM | POA: Insufficient documentation

## 2019-12-23 DIAGNOSIS — R062 Wheezing: Secondary | ICD-10-CM | POA: Diagnosis present

## 2019-12-23 DIAGNOSIS — R5383 Other fatigue: Secondary | ICD-10-CM | POA: Insufficient documentation

## 2019-12-23 DIAGNOSIS — R059 Cough, unspecified: Secondary | ICD-10-CM | POA: Insufficient documentation

## 2019-12-23 DIAGNOSIS — J029 Acute pharyngitis, unspecified: Secondary | ICD-10-CM | POA: Insufficient documentation

## 2019-12-23 DIAGNOSIS — R52 Pain, unspecified: Secondary | ICD-10-CM | POA: Diagnosis present

## 2019-12-23 LAB — POCT RAPID STREP A (OFFICE): Rapid Strep A Screen: NEGATIVE

## 2019-12-23 MED ORDER — BENZONATATE 100 MG PO CAPS: 100.0000 mg | ORAL_CAPSULE | Freq: Three times a day (TID) | ORAL | 0 refills | Status: DC

## 2019-12-23 MED ORDER — PREDNISONE 10 MG (21) PO TBPK: ORAL_TABLET | Freq: Every day | ORAL | 0 refills | Status: AC

## 2019-12-23 MED ORDER — ALBUTEROL SULFATE HFA 108 (90 BASE) MCG/ACT IN AERS: 2.0000 | INHALATION_SPRAY | RESPIRATORY_TRACT | 0 refills | Status: AC | PRN

## 2019-12-23 NOTE — ED Triage Notes (Signed)
Pt reports having nasal congestion and ear pressure x2 days. productive cough, sore throat and body aches. Neg rapid covid test yesterday at school.

## 2019-12-23 NOTE — ED Provider Notes (Signed)
[facility]   [id number] 12/23/19 Arrival Time: 0950   CC: COVID symptoms  SUBJECTIVE: History from: patient.  Tara Daugherty is a 19 y.o. female who presents with abrupt onset of nasal congestion, PND, sore throat, body aches, chills, wheezing, SOB, ear pain, headaches and productive cough for the last 2 days.  Denies sick exposure to COVID, flu or strep. Denies recent travel. Has negative history of Covid. Has not completed Covid vaccines. Has been taking Dayquil and Nyquil for this with little benefit. Symptoms are aggravated with acitivity. Denies previous symptoms in the past. Denies fever,  sinus pain, nausea, changes in bowel or bladder habits.    ROS: As per HPI.  All other pertinent ROS negative.     Past Medical History:  Diagnosis Date  . Anxiety    Past Surgical History:  Procedure Laterality Date  . BACK SURGERY     back injections  . HIP SURGERY    . JOINT REPLACEMENT    . TONSILLECTOMY     No Known Allergies No current facility-administered medications on file prior to encounter.   Current Outpatient Medications on File Prior to Encounter  Medication Sig Dispense Refill  . ALPRAZolam (XANAX) 0.5 MG tablet Take 1 tablet (0.5 mg total) by mouth at bedtime as needed for anxiety. 10 tablet 0  . desvenlafaxine (PRISTIQ) 100 MG 24 hr tablet TK 1 T PO D    . drospirenone-ethinyl estradiol (YAZ) 3-0.02 MG tablet Take by mouth.    . fluticasone (FLONASE) 50 MCG/ACT nasal spray Place 2 sprays into both nostrils daily. 16 g 0  . gabapentin (NEURONTIN) 300 MG capsule Take 300 mg by mouth at bedtime.     . hydrOXYzine (ATARAX/VISTARIL) 50 MG tablet TAKE 1 TABLET BY MOUTH 3 TO 4 TIMES DAILY AS NEEDED    . spironolactone (ALDACTONE) 25 MG tablet     . traZODone (DESYREL) 50 MG tablet      Social History   Socioeconomic History  . Marital status: Single    Spouse name: Not on file  . Number of children: Not on file  . Years of education: Not on file  .  Highest education level: Not on file  Occupational History  . Not on file  Tobacco Use  . Smoking status: Current Some Day Smoker    Types: E-cigarettes  . Smokeless tobacco: Never Used  Vaping Use  . Vaping Use: Never used  Substance and Sexual Activity  . Alcohol use: Yes  . Drug use: Yes    Types: Marijuana    Comment: Rarely  . Sexual activity: Yes  Other Topics Concern  . Not on file  Social History Narrative  . Not on file   Social Determinants of Health   Financial Resource Strain:   . Difficulty of Paying Living Expenses: Not on file  Food Insecurity:   . Worried About Programme researcher, broadcasting/film/video in the Last Year: Not on file  . Ran Out of Food in the Last Year: Not on file  Transportation Needs:   . Lack of Transportation (Medical): Not on file  . Lack of Transportation (Non-Medical): Not on file  Physical Activity:   . Days of Exercise per Week: Not on file  . Minutes of Exercise per Session: Not on file  Stress:   . Feeling of Stress : Not on file  Social Connections:   . Frequency of Communication with Friends and Family: Not on file  . Frequency of Social Gatherings with  Friends and Family: Not on file  . Attends Religious Services: Not on file  . Active Member of Clubs or Organizations: Not on file  . Attends Banker Meetings: Not on file  . Marital Status: Not on file  Intimate Partner Violence:   . Fear of Current or Ex-Partner: Not on file  . Emotionally Abused: Not on file  . Physically Abused: Not on file  . Sexually Abused: Not on file   Family History  Problem Relation Age of Onset  . Healthy Mother   . Healthy Father     OBJECTIVE:  Vitals:   12/23/19 1003 12/23/19 1004  BP: 131/82   Pulse: 100   Resp: 18   Temp: 98.1 F (36.7 C)   TempSrc: Oral   SpO2: 96%   Weight:  148 lb (67.1 kg)  Height:  5\' 6"  (1.676 m)     General appearance: alert; appears fatigued, but nontoxic; speaking in full sentences and tolerating own  secretions HEENT: NCAT; Ears: EACs clear, TMs pearly gray with effusion; Eyes: PERRL.  EOM grossly intact. Sinuses: nontender; Nose: nares patent with clear/yellow rhinorrhea, Throat: oropharynx erythematous, cobblestoning present, tonsils non erythematous or enlarged, uvula midline  Neck: supple with LAD Lungs: unlabored respirations, symmetrical air entry; cough: moderate; no respiratory distress; coarse lung sounds throughout bilateral lung fields, wheezing to bilateral bases Heart: regular rate and rhythm.  Radial pulses 2+ symmetrical bilaterally Skin: warm and dry Psychological: alert and cooperative; normal mood and affect  LABS:  Results for orders placed or performed during the hospital encounter of 12/23/19 (from the past 24 hour(s))  POCT rapid strep A     Status: None   Collection Time: 12/23/19 10:10 AM  Result Value Ref Range   Rapid Strep A Screen Negative Negative     ASSESSMENT & PLAN:  1. Acute bronchitis, unspecified organism   2. Encounter for screening for COVID-19   3. Sore throat   4. Cough   5. Body aches   6. Other fatigue   7. Ear pain, bilateral   8. Wheezing   9. SOB (shortness of breath)     Meds ordered this encounter  Medications  . predniSONE (STERAPRED UNI-PAK 21 TAB) 10 MG (21) TBPK tablet    Sig: Take by mouth daily for 6 days. Take 6 tablets on day 1, 5 tablets on day 2, 4 tablets on day 3, 3 tablets on day 4, 2 tablets on day 5, 1 tablet on day 6    Dispense:  21 tablet    Refill:  0    Order Specific Question:   Supervising Provider    Answer:   13/12/21 Merrilee Jansky  . albuterol (VENTOLIN HFA) 108 (90 Base) MCG/ACT inhaler    Sig: Inhale 2 puffs into the lungs every 4 (four) hours as needed for wheezing or shortness of breath.    Dispense:  18 g    Refill:  0    Order Specific Question:   Supervising Provider    Answer:   [id number] Merrilee Jansky  . benzonatate (TESSALON) 100 MG capsule    Sig: Take 1 capsule (100 mg total)  by mouth every 8 (eight) hours.    Dispense:  21 capsule    Refill:  0    Order Specific Question:   Supervising Provider    Answer:   [id number] Merrilee Jansky  Your rapid strep test is negative.  A throat culture is pending; we will call  you if it is positive requiring treatment.    Chest xray negative for pneumonia today Will treat for bronchitis Prescribed albuterol Prescribed benzonatate Prescribed steroid taper Work and school note provided COVID/Flu/RSV testing ordered.  It will take between 1-2 days for test results.  Someone will contact you regarding abnormal results.    Patient should remain in quarantine until they have received Covid results.  If negative you may resume normal activities (go back to work/school) while practicing hand hygiene, social distance, and mask wearing.  If positive, patient should remain in quarantine for 10 days from symptom onset AND greater than 72 hours after symptoms resolution (absence of fever without the use of fever-reducing medication and improvement in respiratory symptoms), whichever is longer Get plenty of rest and push fluids Use OTC zyrtec for nasal congestion, runny nose, and/or sore throat Use OTC flonase for nasal congestion and runny nose Use medications daily for symptom relief Use OTC medications like ibuprofen or tylenol as needed fever or pain Call or go to the ED if you have any new or worsening symptoms such as fever, worsening cough, shortness of breath, chest tightness, chest pain, turning blue, changes in mental status.  Reviewed expectations re: course of current medical issues. Questions answered. Outlined signs and symptoms indicating need for more acute intervention. Patient verbalized understanding. After Visit Summary given.         Moshe Cipro, NP 12/23/19 1050

## 2019-12-23 NOTE — Discharge Instructions (Addendum)
Your rapid strep test is negative.  A throat culture is pending; we will call you if it is positive requiring treatment.    I have sent in an albuterol inhaler for you to use 2 puffs every 4-6 hours as needed for cough, shortness of breath, wheezing.  I have sent in a prednisone taper for you to take for 6 days. 6 tablets on day one, 5 tablets on day two, 4 tablets on day three, 3 tablets on day four, 2 tablets on day five, and 1 tablet on day six.  I have sent in tessalon perles for you to use one capsule every 8 hours as needed for cough.  Your COVID/Flu/RSV test is pending.  You should self quarantine until the test result is back.    Chest xray is negative today  Take Tylenol as needed for fever or discomfort.  Rest and keep yourself hydrated.    Go to the emergency department if you develop acute worsening symptoms.

## 2019-12-25 LAB — COVID-19, FLU A+B AND RSV
Influenza A, NAA: NOT DETECTED
Influenza B, NAA: NOT DETECTED
RSV, NAA: NOT DETECTED
SARS-CoV-2, NAA: NOT DETECTED

## 2019-12-26 LAB — CULTURE, GROUP A STREP (THRC)

## 2019-12-28 ENCOUNTER — Ambulatory Visit: Payer: Self-pay

## 2020-03-02 ENCOUNTER — Other Ambulatory Visit: Payer: Self-pay

## 2020-03-02 ENCOUNTER — Emergency Department: Payer: BC Managed Care – PPO

## 2020-03-02 ENCOUNTER — Encounter: Payer: Self-pay | Admitting: Emergency Medicine

## 2020-03-02 DIAGNOSIS — W25XXXA Contact with sharp glass, initial encounter: Secondary | ICD-10-CM | POA: Insufficient documentation

## 2020-03-02 DIAGNOSIS — S61421A Laceration with foreign body of right hand, initial encounter: Secondary | ICD-10-CM | POA: Diagnosis not present

## 2020-03-02 DIAGNOSIS — S6991XA Unspecified injury of right wrist, hand and finger(s), initial encounter: Secondary | ICD-10-CM | POA: Diagnosis present

## 2020-03-02 DIAGNOSIS — F17290 Nicotine dependence, other tobacco product, uncomplicated: Secondary | ICD-10-CM | POA: Insufficient documentation

## 2020-03-02 NOTE — ED Triage Notes (Signed)
Patient brought in by ems from home. Patient fell on a wind bottle and reports that she has a piece of glass stuck in her right hand. Bleeding controlled.

## 2020-03-03 ENCOUNTER — Emergency Department
Admission: EM | Admit: 2020-03-03 | Discharge: 2020-03-03 | Disposition: A | Discharge status: Home / Self Care | Payer: BC Managed Care – PPO | Attending: Emergency Medicine | Admitting: Emergency Medicine

## 2020-03-03 DIAGNOSIS — S61421A Laceration with foreign body of right hand, initial encounter: Secondary | ICD-10-CM

## 2020-03-03 MED ORDER — ACETAMINOPHEN 500 MG PO TABS
ORAL_TABLET | ORAL | Status: AC
  Filled 2020-03-03: qty 2

## 2020-03-03 MED ORDER — BACITRACIN ZINC 500 UNIT/GM EX OINT
TOPICAL_OINTMENT | Freq: Once | CUTANEOUS | Status: AC
  Filled 2020-03-03: qty 0.9

## 2020-03-03 MED ORDER — ONDANSETRON 4 MG PO TBDP
4.0000 mg | ORAL_TABLET | Freq: Once | ORAL | Status: AC
  Administered 2020-03-03: 4 mg via ORAL
  Filled 2020-03-03: qty 1

## 2020-03-03 MED ORDER — LIDOCAINE-EPINEPHRINE 2 %-1:100000 IJ SOLN
20.0000 mL | Freq: Once | INTRAMUSCULAR | Status: AC
  Administered 2020-03-03: 20 mL via INTRADERMAL
  Filled 2020-03-03: qty 1

## 2020-03-03 MED ORDER — OXYCODONE-ACETAMINOPHEN 5-325 MG PO TABS
1.0000 | ORAL_TABLET | Freq: Once | ORAL | Status: AC
  Administered 2020-03-03: 1 via ORAL
  Filled 2020-03-03: qty 1

## 2020-03-03 MED ORDER — ACETAMINOPHEN 500 MG PO TABS
1000.0000 mg | ORAL_TABLET | Freq: Once | ORAL | Status: AC | PRN
  Administered 2020-03-03: 1000 mg via ORAL

## 2020-03-03 NOTE — ED Notes (Signed)
Splint was applied to pt left hand and wrap in gauze at this time

## 2020-03-03 NOTE — ED Provider Notes (Signed)
[facility] Emergency Department Provider Note  ____________________________________________   Event Date/Time   First MD Initiated Contact with Patient 03/03/20 0148     (approximate)  I have reviewed the triage vital signs and the nursing notes.   HISTORY  Chief Complaint Laceration    HPI Mariette Cowley is a 20 y.o. female who is right-hand dominant with history of anxiety presents to the emergency department with laceration to the ulnar aspect of her right hand. States that she slipped on ice tonight carrying a wine bottle in the wine bottle broke in her hand. She has a retained piece of glass that she can see. Pain with flexing and extending the right fifth digit but is able to do so fully. States her last tetanus vaccination was in the last 5 years. Did not hit her head or lose consciousness. No other injury.        Past Medical History:  Diagnosis Date  . Anxiety     There are no problems to display for this patient.   Past Surgical History:  Procedure Laterality Date  . BACK SURGERY     back injections  . HIP SURGERY    . JOINT REPLACEMENT    . TONSILLECTOMY      Prior to Admission medications   Medication Sig Start Date End Date Taking? Authorizing Provider  albuterol (VENTOLIN HFA) 108 (90 Base) MCG/ACT inhaler Inhale 2 puffs into the lungs every 4 (four) hours as needed for wheezing or shortness of breath. 12/23/19   Moshe Cipro, NP  ALPRAZolam Prudy Feeler) 0.5 MG tablet Take 1 tablet (0.5 mg total) by mouth at bedtime as needed for anxiety. 11/16/17   Irean Hong, MD  benzonatate (TESSALON) 100 MG capsule Take 1 capsule (100 mg total) by mouth every 8 (eight) hours. 12/23/19   Moshe Cipro, NP  desvenlafaxine (PRISTIQ) 100 MG 24 hr tablet TK 1 T PO D 02/17/18   [provider]  drospirenone-ethinyl estradiol (YAZ) 3-0.02 MG tablet Take by mouth. 02/18/19   [provider]  fluticasone (FLONASE) 50 MCG/ACT  nasal spray Place 2 sprays into both nostrils daily. 10/24/17   Menshew, Charlesetta Ivory, PA-C  gabapentin (NEURONTIN) 300 MG capsule Take 300 mg by mouth at bedtime.     [provider]  hydrOXYzine (ATARAX/VISTARIL) 50 MG tablet TAKE 1 TABLET BY MOUTH 3 TO 4 TIMES DAILY AS NEEDED 02/22/19   [provider]  spironolactone (ALDACTONE) 25 MG tablet  05/02/19   [provider]  traZODone (DESYREL) 50 MG tablet  05/02/19   [provider]    Allergies Patient has no known allergies.  Family History  Problem Relation Age of Onset  . Healthy Mother   . Healthy Father     Social History Social History   Tobacco Use  . Smoking status: Current Some Day Smoker    Types: E-cigarettes  . Smokeless tobacco: Never Used  Vaping Use  . Vaping Use: Never used  Substance Use Topics  . Alcohol use: Yes  . Drug use: Yes    Types: Marijuana    Comment: Rarely    Review of Systems Constitutional: No fever. Eyes: No visual changes. ENT: No sore throat. Cardiovascular: Denies chest pain. Respiratory: Denies shortness of breath. Gastrointestinal: No nausea, vomiting, diarrhea. Genitourinary: Negative for dysuria. Musculoskeletal: Negative for back pain. Skin: Negative for rash. Neurological: Negative for focal weakness or numbness.  ____________________________________________   PHYSICAL EXAM:  VITAL SIGNS: ED Triage Vitals [  03/02/20 2301]  Enc Vitals Group     BP 131/82     Pulse Rate 83     Resp 18     Temp 98.7 F (37.1 C)     Temp Source Oral     SpO2 100 %     Weight 148 lb (67.1 kg)     Height 5\' 6"  (1.676 m)     Head Circumference      Peak Flow      Pain Score 8     Pain Loc      Pain Edu?      Excl. in GC?    CONSTITUTIONAL: Alert and responds appropriately to questions. Well-appearing; well-nourished HEAD: Normocephalic, atraumatic EYES: Conjunctivae clear, pupils appear equal ENT: normal nose; moist mucous membranes NECK:  Normal range of motion CARD: Regular rate and rhythm RESP: Normal chest excursion without splinting or tachypnea; no hypoxia or respiratory distress, speaking full sentences ABD/GI: non-distended EXT: Normal ROM in all joints, 5 cm laceration that appears superficial and hemostatic to the ulnar aspect of the right hand with appreciated foreign body. She has full range of motion with flexion and extension of the right fifth digit. 2+ right radial pulse normal capillary refill. She reports decreased sensation throughout the entire right fifth digit. After wound has been anesthetized, patient does appear to have an extensor tendon laceration.  SKIN: Normal color for age and race, no rashes on exposed skin NEURO: Moves all extremities equally, normal speech, no facial asymmetry noted PSYCH: The patient's mood and manner are appropriate. Grooming and personal hygiene are appropriate.  ____________________________________________   LABS (all labs ordered are listed, but only abnormal results are displayed)  Labs Reviewed - No data to display ____________________________________________  EKG  None ____________________________________________  RADIOLOGY I, Amirr Achord, personally viewed and evaluated these images (plain radiographs) as part of my medical decision making, as well as reviewing the written report by the radiologist.  ED MD interpretation: No fracture of the right hand. Patient does have retained foreign body.  Official radiology report(s): DG Hand Complete Right  Result Date: 03/02/2020 CLINICAL DATA:  Right hand foreign body EXAM: RIGHT HAND - COMPLETE 3+ VIEW COMPARISON:  None. FINDINGS: Three view radiograph right hand demonstrates a retained radiodense rectangular foreign body within the soft tissues ventral and lateral to the mid to distal diaphysis of the fifth metacarpal measuring 1.6 x 2.4 cm in greatest dimension. Overlying soft tissue deformity in keeping with soft  tissue laceration. No superimposed acute fracture or dislocation identified. IMPRESSION: 2.4 cm retained radiopaque rectangular foreign body within the lateral soft tissues of the right hand. Electronically Signed   By: 03/04/2020 MD   On: 03/02/2020 23:26    ____________________________________________   PROCEDURES  Procedure(s) performed (including Critical Care):  Procedures  LACERATION REPAIR Performed by: 03/04/2020 Authorized by: Rochele Raring Consent: Verbal consent obtained. Risks and benefits: risks, benefits and alternatives were discussed Consent given by: patient Patient identity confirmed: provided demographic data Prepped and Draped in normal sterile fashion Wound explored  Laceration Location: Right hand  Laceration Length: 5cm  No Foreign Bodies seen or palpated  Anesthesia: local infiltration  Local anesthetic: lidocaine 2% with epinephrine  Anesthetic total: 8 ml  Irrigation method: syringe Amount of cleaning: standard  Skin closure: Superficial  Number of sutures: 12  Technique: Area anesthetized using lidocaine 2% with epinephrine. Wound irrigated copiously with sterile saline. Wound then cleaned with Betadine and draped in sterile fashion. Wound closed  using 12 simple interrupted sutures with 4-0 Prolene.  Bacitracin and sterile dressing applied. Good wound approximation and hemostasis achieved.   Patient tolerance: Patient tolerated the procedure well with no immediate complications.    ____________________________________________   INITIAL IMPRESSION / ASSESSMENT AND PLAN / ED COURSE  As part of my medical decision making, I reviewed the following data within the electronic MEDICAL RECORD NUMBER Nursing notes reviewed and incorporated, Radiograph reviewed and shows large foreign body but no fracture or dislocation and Notes from prior ED visits         Patient here with laceration to the right hand. No other injury. Neurovascular intact  distally. She does have a retained foreign body. Will numb this area, remove foreign body, clean and closed. No other sign of traumatic injury on exam.   Foreign body removed. Wound irrigated copiously without any other foreign bodies. After area was anesthetized, does appear that she has an extensor tendon laceration. Will refer to hand surgery as an outpatient. She does have full flexion and extension of the fifth digit. She reports diminished sensation in her right fifth finger. Discussed with her that she may have cut a peripheral nerve. She has good capillary refill and strong pulses. Wound is hemostatic. Wound well approximated with 12 sutures. I do not feel she needs antibiotics at this time but have discussed wound care instructions and return precautions. Recommended alternating Tylenol and Motrin for pain. She is comfortable with this plan.   At this time, I do not feel there is any life-threatening condition present. I have reviewed, interpreted and discussed all results (EKG, imaging, lab, urine as appropriate) and exam findings with patient/family. I have reviewed nursing notes and appropriate previous records.  I feel the patient is safe to be discharged home without further emergent workup and can continue workup as an outpatient as needed. Discussed usual and customary return precautions. Patient/family verbalize understanding and are comfortable with this plan.  Outpatient follow-up has been provided as needed. All questions have been answered.      ____________________________________________   FINAL CLINICAL IMPRESSION(S) / ED DIAGNOSES  Final diagnoses:  Laceration of right hand with foreign body, initial encounter     ED Discharge Orders    None      *Please note:  Jessiah Wojnar was evaluated in Emergency Department on 03/03/2020 for the symptoms described in the history of present illness. She was evaluated in the context of the global COVID-19 pandemic, which  necessitated consideration that the patient might be at risk for infection with the SARS-CoV-2 virus that causes COVID-19. Institutional protocols and algorithms that pertain to the evaluation of patients at risk for COVID-19 are in a state of rapid change based on information released by regulatory bodies including the CDC and federal and state organizations. These policies and algorithms were followed during the patient's care in the ED.  Some ED evaluations and interventions may be delayed as a result of limited staffing during and the pandemic.*   Note:  This document was prepared using Dragon voice recognition software and may include unintentional dictation errors.   Dejana Pugsley, Layla Maw, DO 03/03/20 [phone]

## 2020-03-03 NOTE — Discharge Instructions (Addendum)
You may alternate Tylenol 1000 mg every 6 hours as needed for pain, fever and Ibuprofen 800 mg every 8 hours as needed for pain, fever.  Please take Ibuprofen with food.  Do not take more than 4000 mg of Tylenol (acetaminophen) in a 24 hour period.  You do not need antibiotics at this time.  Please monitor your wound for any signs of infection such as redness, warmth, drainage of pus or if you begin having a fever of 100.4 or higher.  Please keep wound clean and dry.  You may clean your wound gently with warm soap and water.  You may apply over-the-counter Neosporin ointment 1-2 times a day and cover with a bandage.  I recommend follow-up with hand surgery as an outpatient as you did appear to have an extensor tendon laceration.  You may not need any further treatment given you do have good range of motion in this digit.  The sensation in your fifth finger will likely come back with time.

## 2020-04-15 ENCOUNTER — Encounter: Payer: Self-pay | Admitting: Emergency Medicine

## 2020-04-15 ENCOUNTER — Ambulatory Visit
Admission: EM | Admit: 2020-04-15 | Discharge: 2020-04-15 | Disposition: A | Discharge status: Home / Self Care | Payer: BC Managed Care – PPO

## 2020-04-15 ENCOUNTER — Other Ambulatory Visit: Payer: Self-pay

## 2020-04-15 DIAGNOSIS — R21 Rash and other nonspecific skin eruption: Secondary | ICD-10-CM

## 2020-04-15 MED ORDER — VALACYCLOVIR HCL 1 G PO TABS: 1000.0000 mg | ORAL_TABLET | Freq: Three times a day (TID) | ORAL | 0 refills | Status: AC

## 2020-04-15 MED ORDER — TRIAMCINOLONE ACETONIDE 0.1 % EX CREA: 1.0000 "application " | TOPICAL_CREAM | Freq: Two times a day (BID) | CUTANEOUS | 0 refills | Status: AC

## 2020-04-15 NOTE — Discharge Instructions (Signed)
We are initiating treatment for shingles with valtrex, an antiviral.  You may also you the steroid cream I have sent, to try to help with the itching and rash.  This rash still may progress to fluid filled vesicles or even burning pain to the area.  Return for any further concerns or questions as needed.

## 2020-04-15 NOTE — ED Provider Notes (Signed)
[facility]    CSN: [id number] Arrival date & time: 04/15/20  1415      History   Chief Complaint Chief Complaint  Patient presents with  . Rash    HPI Tara Daugherty is a 20 y.o. female.   Tara Daugherty presents with complaints of rash to right anterior chest near collar bone which has erupted over the past few days. She has felt fatigued and somewhat generally unwell but no recent fevers. The rash is somewhat itchy and tingles, but no burning or pain. No vesicles. She has not had chicken pox in the past. She has had a similar rash in similar location in the past which was diagnosed as shingles, and she feels this is the same. She has applied hydrocortisone cream to the area which hasn't necessarily helped. No known exposures to new products or other potential allergens. She has been under increased stress.     ROS per HPI, negative if not otherwise mentioned.      Past Medical History:  Diagnosis Date  . Anxiety     There are no problems to display for this patient.   Past Surgical History:  Procedure Laterality Date  . BACK SURGERY     back injections  . HAND SURGERY Right   . HIP SURGERY    . JOINT REPLACEMENT    . TONSILLECTOMY      OB History   No obstetric history on file.      Home Medications    Prior to Admission medications   Medication Sig Start Date End Date Taking? Authorizing Provider  ALPRAZolam Prudy Feeler) 0.5 MG tablet Take 1 tablet (0.5 mg total) by mouth at bedtime as needed for anxiety. 11/16/17  Yes Irean Hong, MD  desvenlafaxine (PRISTIQ) 100 MG 24 hr tablet TK 1 T PO D 02/17/18  Yes [provider]  drospirenone-ethinyl estradiol (OCELLA) 3-0.03 MG tablet Take 1 tablet by mouth daily.   Yes [provider]  fluticasone (FLONASE) 50 MCG/ACT nasal spray Place 2 sprays into both nostrils daily. 10/24/17  Yes Menshew, Charlesetta Ivory, PA-C  hydrOXYzine (ATARAX/VISTARIL) 50 MG tablet TAKE 1 TABLET BY MOUTH 3 TO  4 TIMES DAILY AS NEEDED 02/22/19  Yes [provider]  spironolactone (ALDACTONE) 25 MG tablet  05/02/19  Yes [provider]  traZODone (DESYREL) 50 MG tablet  05/02/19  Yes [provider]  triamcinolone (KENALOG) 0.1 % Apply 1 application topically 2 (two) times daily. 04/15/20  Yes Reginald Weida, Barron Alvine, NP  valACYclovir (VALTREX) 1000 MG tablet Take 1 tablet (1,000 mg total) by mouth 3 (three) times daily for 7 days. 04/15/20 04/22/20 Yes Mabrey Howland, Barron Alvine, NP  albuterol (VENTOLIN HFA) 108 (90 Base) MCG/ACT inhaler Inhale 2 puffs into the lungs every 4 (four) hours as needed for wheezing or shortness of breath. 12/23/19   Moshe Cipro, NP  benzonatate (TESSALON) 100 MG capsule Take 1 capsule (100 mg total) by mouth every 8 (eight) hours. 12/23/19   Moshe Cipro, NP  drospirenone-ethinyl estradiol (YAZ) 3-0.02 MG tablet Take by mouth. 02/18/19   [provider]  gabapentin (NEURONTIN) 300 MG capsule Take 300 mg by mouth at bedtime.     [provider]    Family History Family History  Problem Relation Age of Onset  . Healthy Mother   . Healthy Father     Social History Social History   Tobacco Use  . Smoking status: Current Some Day Smoker    Types: E-cigarettes  .  Smokeless tobacco: Never Used  Vaping Use  . Vaping Use: Never used  Substance Use Topics  . Alcohol use: Yes  . Drug use: Yes    Types: Marijuana    Comment: Rarely     Allergies   Patient has no known allergies.   Review of Systems Review of Systems   Physical Exam Triage Vital Signs ED Triage Vitals  Enc Vitals Group     BP 04/15/20 1425 120/84     Pulse Rate 04/15/20 1425 (!) 106     Resp 04/15/20 1425 14     Temp 04/15/20 1425 98.1 F (36.7 C)     Temp Source 04/15/20 1425 Oral     SpO2 04/15/20 1425 100 %     Weight 04/15/20 1424 136 lb (61.7 kg)     Height 04/15/20 1424 5\' 6"  (1.676 m)     Head Circumference --      Peak Flow --      Pain Score  04/15/20 1424 4     Pain Loc --      Pain Edu? --      Excl. in GC? --    No data found.  Updated Vital Signs BP 120/84 (BP Location: Left Arm)   Pulse (!) 106   Temp 98.1 F (36.7 C) (Oral)   Resp 14   Ht 5\' 6"  (1.676 m)   Wt 136 lb (61.7 kg)   LMP 03/25/2020 (Approximate)   SpO2 100%   BMI 21.95 kg/m   Visual Acuity Right Eye Distance:   Left Eye Distance:   Bilateral Distance:    Right Eye Near:   Left Eye Near:    Bilateral Near:     Physical Exam Constitutional:      General: She is not in acute distress.    Appearance: She is well-developed.  Cardiovascular:     Rate and Rhythm: Normal rate.  Pulmonary:     Effort: Pulmonary effort is normal.  Skin:    General: Skin is warm and dry.          Comments: Red rash to right clavicular chest, cluster of red flat papules more midline and then more diffusely scattered red more laterally; no vesicles or pustules; non tender   Neurological:     Mental Status: She is alert and oriented to person, place, and time.      UC Treatments / Results  Labs (all labs ordered are listed, but only abnormal results are displayed) Labs Reviewed - No data to display  EKG   Radiology No results found.  Procedures Procedures (including critical care time)  Medications Ordered in UC Medications - No data to display  Initial Impression / Assessment and Plan / UC Course  I have reviewed the triage vital signs and the nursing notes.  Pertinent labs & imaging results that were available during my care of the patient were reviewed by me and considered in my medical decision making (see chart for details).     Concern for shingles, has had similar in same location in the past. Valtrex provided. Kenalog also provided. Dermatitis from irritant or allergen also considered.  Patient verbalized understanding and agreeable to plan.   Final Clinical Impressions(s) / UC Diagnoses   Final diagnoses:  Rash     Discharge  Instructions     We are initiating treatment for shingles with valtrex, an antiviral.  You may also you the steroid cream I have sent, to try to help with  the itching and rash.  This rash still may progress to fluid filled vesicles or even burning pain to the area.  Return for any further concerns or questions as needed.     ED Prescriptions    Medication Sig Dispense Auth. Provider   valACYclovir (VALTREX) 1000 MG tablet Take 1 tablet (1,000 mg total) by mouth 3 (three) times daily for 7 days. 21 tablet Linus Mako B, NP   triamcinolone (KENALOG) 0.1 % Apply 1 application topically 2 (two) times daily. 30 g Georgetta Haber, NP     PDMP not reviewed this encounter.   Georgetta Haber, NP 04/15/20 [phone]

## 2020-04-15 NOTE — ED Triage Notes (Signed)
Patient c/o itchy red rash that started 2 days.  Patient states that it is just on the right side of her chest.  Patient also reports fatigue.  Patient states that she had Shingles in 2019.

## 2020-05-24 ENCOUNTER — Emergency Department
Admission: EM | Admit: 2020-05-24 | Discharge: 2020-05-24 | Disposition: A | Discharge status: Home / Self Care | Payer: BC Managed Care – PPO | Attending: Emergency Medicine | Admitting: Emergency Medicine

## 2020-05-24 ENCOUNTER — Emergency Department: Payer: BC Managed Care – PPO

## 2020-05-24 ENCOUNTER — Other Ambulatory Visit: Payer: Self-pay

## 2020-05-24 ENCOUNTER — Encounter: Payer: Self-pay | Admitting: Emergency Medicine

## 2020-05-24 DIAGNOSIS — B349 Viral infection, unspecified: Secondary | ICD-10-CM | POA: Diagnosis not present

## 2020-05-24 DIAGNOSIS — E860 Dehydration: Secondary | ICD-10-CM | POA: Diagnosis not present

## 2020-05-24 DIAGNOSIS — Z28310 Unvaccinated for covid-19: Secondary | ICD-10-CM | POA: Insufficient documentation

## 2020-05-24 DIAGNOSIS — N390 Urinary tract infection, site not specified: Secondary | ICD-10-CM | POA: Diagnosis not present

## 2020-05-24 DIAGNOSIS — R000 Tachycardia, unspecified: Secondary | ICD-10-CM | POA: Insufficient documentation

## 2020-05-24 DIAGNOSIS — Z20822 Contact with and (suspected) exposure to covid-19: Secondary | ICD-10-CM | POA: Insufficient documentation

## 2020-05-24 DIAGNOSIS — Z87891 Personal history of nicotine dependence: Secondary | ICD-10-CM | POA: Insufficient documentation

## 2020-05-24 DIAGNOSIS — Z96641 Presence of right artificial hip joint: Secondary | ICD-10-CM | POA: Diagnosis not present

## 2020-05-24 DIAGNOSIS — R0981 Nasal congestion: Secondary | ICD-10-CM | POA: Diagnosis present

## 2020-05-24 LAB — COMPREHENSIVE METABOLIC PANEL
ALT: 13 U/L (ref 0–44)
AST: 18 U/L (ref 15–41)
Albumin: 4 g/dL (ref 3.5–5.0)
Alkaline Phosphatase: 61 U/L (ref 38–126)
Anion gap: 11 (ref 5–15)
BUN: 7 mg/dL (ref 6–20)
CO2: 21 mmol/L — ABNORMAL LOW (ref 22–32)
Calcium: 9.1 mg/dL (ref 8.9–10.3)
Chloride: 100 mmol/L (ref 98–111)
Creatinine, Ser: 0.79 mg/dL (ref 0.44–1.00)
GFR, Estimated: >60 mL/min (ref >60)
Glucose, Bld: 104 mg/dL — ABNORMAL HIGH (ref 70–99)
Potassium: 3.7 mmol/L (ref 3.5–5.1)
Sodium: 132 mmol/L — ABNORMAL LOW (ref 135–145)
Total Bilirubin: 0.9 mg/dL (ref 0.3–1.2)
Total Protein: 7.8 g/dL (ref 6.5–8.1)

## 2020-05-24 LAB — CBC WITH DIFFERENTIAL/PLATELET
Abs Immature Granulocytes: 0.05 10*3/uL (ref 0.00–0.07)
Basophils Absolute: 0 10*3/uL (ref 0.0–0.1)
Basophils Relative: 0 %
Eosinophils Absolute: 0 10*3/uL (ref 0.0–0.5)
Eosinophils Relative: 0 %
HCT: 39.2 % (ref 36.0–46.0)
Hemoglobin: 13.2 g/dL (ref 12.0–15.0)
Immature Granulocytes: 1 %
Lymphocytes Relative: 18 %
Lymphs Abs: 1.3 10*3/uL (ref 0.7–4.0)
MCH: 27.4 pg (ref 26.0–34.0)
MCHC: 33.7 g/dL (ref 30.0–36.0)
MCV: 81.5 fL (ref 80.0–100.0)
Monocytes Absolute: 0.8 10*3/uL (ref 0.1–1.0)
Monocytes Relative: 11 %
Neutro Abs: 5.3 10*3/uL (ref 1.7–7.7)
Neutrophils Relative %: 70 %
Platelets: 283 10*3/uL (ref 150–400)
RBC: 4.81 MIL/uL (ref 3.87–5.11)
RDW: 12.6 % (ref 11.5–15.5)
Smear Review: NORMAL
WBC: 7.5 10*3/uL (ref 4.0–10.5)
nRBC: 0 % (ref 0.0–0.2)

## 2020-05-24 LAB — URINALYSIS, COMPLETE (UACMP) WITH MICROSCOPIC
Bacteria, UA: NONE SEEN
Bilirubin Urine: NEGATIVE
Glucose, UA: NEGATIVE mg/dL
Hgb urine dipstick: NEGATIVE
Ketones, ur: 20 mg/dL — AB
Nitrite: NEGATIVE
Protein, ur: NEGATIVE mg/dL
Specific Gravity, Urine: 1.021 (ref 1.005–1.030)
pH: 6 (ref 5.0–8.0)

## 2020-05-24 LAB — LIPASE, BLOOD: Lipase: 29 U/L (ref 11–51)

## 2020-05-24 LAB — RESP PANEL BY RT-PCR (FLU A&B, COVID) ARPGX2
Influenza A by PCR: NEGATIVE
Influenza B by PCR: NEGATIVE
SARS Coronavirus 2 by RT PCR: NEGATIVE

## 2020-05-24 LAB — POC URINE PREG, ED: Preg Test, Ur: NEGATIVE

## 2020-05-24 LAB — LACTIC ACID, PLASMA: Lactic Acid, Venous: 1.1 mmol/L (ref 0.5–1.9)

## 2020-05-24 LAB — GROUP A STREP BY PCR: Group A Strep by PCR: NOT DETECTED

## 2020-05-24 LAB — MONONUCLEOSIS SCREEN: Mono Screen: NEGATIVE

## 2020-05-24 LAB — PROCALCITONIN: Procalcitonin: 0.19 ng/mL

## 2020-05-24 MED ORDER — ONDANSETRON HCL 4 MG/2ML IJ SOLN
4.0000 mg | Freq: Once | INTRAMUSCULAR | Status: AC
  Administered 2020-05-24: 4 mg via INTRAVENOUS
  Filled 2020-05-24: qty 2

## 2020-05-24 MED ORDER — ONDANSETRON 4 MG PO TBDP: 4.0000 mg | ORAL_TABLET | Freq: Three times a day (TID) | ORAL | 0 refills | Status: AC | PRN

## 2020-05-24 MED ORDER — KETOROLAC TROMETHAMINE 30 MG/ML IJ SOLN
15.0000 mg | Freq: Once | INTRAMUSCULAR | Status: AC
  Administered 2020-05-24: 15 mg via INTRAVENOUS
  Filled 2020-05-24: qty 1

## 2020-05-24 MED ORDER — SODIUM CHLORIDE 0.9 % IV SOLN
1.0000 g | INTRAVENOUS | Status: DC
  Administered 2020-05-24: 1 g via INTRAVENOUS
  Filled 2020-05-24: qty 10

## 2020-05-24 MED ORDER — CEPHALEXIN 500 MG PO CAPS: 500.0000 mg | ORAL_CAPSULE | Freq: Three times a day (TID) | ORAL | 0 refills | Status: DC

## 2020-05-24 MED ORDER — SODIUM CHLORIDE 0.9 % IV BOLUS
1000.0000 mL | Freq: Once | INTRAVENOUS | Status: AC
  Administered 2020-05-24: 1000 mL via INTRAVENOUS

## 2020-05-24 NOTE — Discharge Instructions (Addendum)
1.  Alternate Tylenol and Ibuprofen every 4 hours as needed for fever greater than 100.4 F. 2.  Take antibiotic as prescribed until finished (Keflex 500 mg 3 times daily x7 days). 3.  You may take Zofran as needed for nausea. 4.  Return to the ER for worsening symptoms, persistent vomiting, difficulty breathing or other concerns.

## 2020-05-24 NOTE — ED Provider Notes (Signed)
[facility] Emergency Department Provider Note   ____________________________________________   Event Date/Time   First MD Initiated Contact with Patient 05/24/20 0209     (approximate)  I have reviewed the triage vital signs and the nursing notes.   HISTORY  Chief Complaint Nasal Congestion    HPI Tara Daugherty is a 20 y.o. female who [facility] with a chief complaint of flulike symptoms.  Patient reports a 1 day history of fever, chills, malaise, myalgias, congestion, headache, sore throat, nonproductive cough and nausea.  She was seen at urgent care with negative rapid COVID and flu swabs.  She was placed on Tamiflu and has taken 1 dose.  Of note, patient was diagnosed with influenza A 2 weeks ago and finished a course of Tamiflu at that time.  Denies chest pain, shortness of breath, abdominal pain, vomiting, dysuria or diarrhea.  Patient is unvaccinated against COVID-19.     Past Medical History:  Diagnosis Date  . Anxiety     There are no problems to display for this patient.   Past Surgical History:  Procedure Laterality Date  . BACK SURGERY     back injections  . HAND SURGERY Right   . HIP SURGERY    . JOINT REPLACEMENT    . TONSILLECTOMY      Prior to Admission medications   Medication Sig Start Date End Date Taking? Authorizing Provider  cephALEXin (KEFLEX) 500 MG capsule Take 1 capsule (500 mg total) by mouth 3 (three) times daily. 05/24/20  Yes Irean HongSung, Percy Comp J, MD  ondansetron (ZOFRAN ODT) 4 MG disintegrating tablet Take 1 tablet (4 mg total) by mouth every 8 (eight) hours as needed for nausea or vomiting. 05/24/20  Yes Irean HongSung, Kemonte Ullman J, MD  albuterol (VENTOLIN HFA) 108 (90 Base) MCG/ACT inhaler Inhale 2 puffs into the lungs every 4 (four) hours as needed for wheezing or shortness of breath. 12/23/19   Moshe CiproMatthews, Stephanie, NP  ALPRAZolam Prudy Feeler(XANAX) 0.5 MG tablet Take 1 tablet (0.5 mg total) by mouth at bedtime as  needed for anxiety. 11/16/17   Irean HongSung, Anali Cabanilla J, MD  benzonatate (TESSALON) 100 MG capsule Take 1 capsule (100 mg total) by mouth every 8 (eight) hours. 12/23/19   Moshe CiproMatthews, Stephanie, NP  desvenlafaxine (PRISTIQ) 100 MG 24 hr tablet TK 1 T PO D 02/17/18   [provider]  drospirenone-ethinyl estradiol (OCELLA) 3-0.03 MG tablet Take 1 tablet by mouth daily.    [provider]  drospirenone-ethinyl estradiol (YAZ) 3-0.02 MG tablet Take by mouth. 02/18/19   [provider]  fluticasone (FLONASE) 50 MCG/ACT nasal spray Place 2 sprays into both nostrils daily. 10/24/17   Menshew, Charlesetta IvoryJenise V Bacon, PA-C  gabapentin (NEURONTIN) 300 MG capsule Take 300 mg by mouth at bedtime.     [provider]  hydrOXYzine (ATARAX/VISTARIL) 50 MG tablet TAKE 1 TABLET BY MOUTH 3 TO 4 TIMES DAILY AS NEEDED 02/22/19   [provider]  spironolactone (ALDACTONE) 25 MG tablet  05/02/19   [provider]  traZODone (DESYREL) 50 MG tablet  05/02/19   [provider]  triamcinolone (KENALOG) 0.1 % Apply 1 application topically 2 (two) times daily. 04/15/20   Georgetta HaberBurky, Natalie B, NP    Allergies Patient has no known allergies.  Family History  Problem Relation Age of Onset  . Healthy Mother   . Healthy Father     Social History Social History   Tobacco Use  . Smoking status:  Former Smoker    Types: E-cigarettes  . Smokeless tobacco: Never Used  Vaping Use  . Vaping Use: Every day  Substance Use Topics  . Alcohol use: Yes  . Drug use: Yes    Types: Marijuana    Comment: Rarely    Review of Systems  Constitutional: Positive for fever/chills, generalized malaise and myalgias. Eyes: No visual changes. ENT: Positive for sore throat. Cardiovascular: Denies chest pain. Respiratory: Positive for cough.  Denies shortness of breath. Gastrointestinal: No abdominal pain.  Positive for nausea, no vomiting.  No diarrhea.  No constipation. Genitourinary: Negative for  dysuria. Musculoskeletal: Negative for back pain. Skin: Negative for rash. Neurological: Positive for headache. Negative for focal weakness or numbness.   ____________________________________________   PHYSICAL EXAM:  VITAL SIGNS: ED Triage Vitals  Enc Vitals Group     BP 05/24/20 0055 116/76     Pulse Rate 05/24/20 0055 (!) 128     Resp 05/24/20 0055 20     Temp 05/24/20 0055 99.7 F (37.6 C)     Temp Source 05/24/20 0055 Oral     SpO2 05/24/20 0055 99 %     Weight 05/24/20 0055 135 lb (61.2 kg)     Height 05/24/20 0055 5\' 6"  (1.676 m)     Head Circumference --      Peak Flow --      Pain Score 05/24/20 0059 8     Pain Loc --      Pain Edu? --      Excl. in [place]? --     Constitutional: Alert and oriented. Well appearing and in mild acute distress. Eyes: Conjunctivae are normal. PERRL. EOMI. Head: Atraumatic. Nose: Congestion/rhinnorhea. Mouth/Throat: Mucous membranes are moist.  Oropharynx mildly erythematous without tonsillar swelling, exudates or peritonsillar abscess.  There is no hoarse or muffled voice.  There is no drooling. Neck: No stridor.  Supple neck without meningismus. Hematological/Lymphatic/Immunilogical: No cervical lymphadenopathy. Cardiovascular: Tachycardic rate, regular rhythm. Grossly normal heart sounds.  Good peripheral circulation. Respiratory: Normal respiratory effort.  No retractions. Lungs CTAB. Gastrointestinal: Soft and nontender to light or deep palpation. No distention. No abdominal bruits. No CVA tenderness. Musculoskeletal: No lower extremity tenderness nor edema.  No joint effusions. Neurologic: Alert and oriented x3.  CN II to XII grossly intact.  Normal speech and language. No gross focal neurologic deficits are appreciated. No gait instability. Skin:  Skin is warm, dry and intact. No rash noted.  No petechiae. Psychiatric: Mood and affect are normal. Speech and behavior are normal.  ____________________________________________    LABS (all labs ordered are listed, but only abnormal results are displayed)  Labs Reviewed  COMPREHENSIVE METABOLIC PANEL - Abnormal; Notable for the following components:      Result Value   Sodium 132 (*)    CO2 21 (*)    Glucose, Bld 104 (*)    All other components within normal limits  URINALYSIS, COMPLETE (UACMP) WITH MICROSCOPIC - Abnormal; Notable for the following components:   Color, Urine AMBER (*)    APPearance HAZY (*)    Ketones, ur 20 (*)    Leukocytes,Ua MODERATE (*)    All other components within normal limits  CULTURE, BLOOD (ROUTINE X 2)  CULTURE, BLOOD (ROUTINE X 2)  RESP PANEL BY RT-PCR (FLU A&B, COVID) ARPGX2  GROUP A STREP BY PCR  URINE CULTURE  LACTIC ACID, PLASMA  CBC WITH DIFFERENTIAL/PLATELET  LIPASE, BLOOD  MONONUCLEOSIS SCREEN  PROCALCITONIN  POC URINE PREG, ED   ____________________________________________  EKG  None ____________________________________________  RADIOLOGY I, Terion Hedman J, personally viewed and evaluated these images (plain radiographs) as part of my medical decision making, as well as reviewing the written report by the radiologist.  ED MD interpretation: No acute cardiopulmonary process  Official radiology report(s): DG Chest 2 View  Result Date: 05/24/2020 CLINICAL DATA:  Fever EXAM: CHEST - 2 VIEW COMPARISON:  12/23/2019 FINDINGS: The heart size and mediastinal contours are within normal limits. Both lungs are clear. The visualized skeletal structures are unremarkable. IMPRESSION: No active cardiopulmonary disease. Electronically Signed   By: Helyn Numbers MD   On: 05/24/2020 02:27    ____________________________________________   PROCEDURES  Procedure(s) performed (including Critical Care):  .1-3 Lead EKG Interpretation Performed by: Irean Hong, MD Authorized by: Irean Hong, MD     Interpretation: abnormal     ECG rate:  125   ECG rate assessment: tachycardic     Rhythm: sinus tachycardia     Ectopy:  none     Conduction: normal   Comments:     Patient placed on cardiac monitor to evaluate for arrhythmias     ____________________________________________   INITIAL IMPRESSION / ASSESSMENT AND PLAN / ED COURSE  As part of my medical decision making, I reviewed the following data within the electronic MEDICAL RECORD NUMBER Nursing notes reviewed and incorporated, Labs reviewed, EKG interpreted, Old chart reviewed, Radiograph reviewed and Notes from prior ED visits     20 year old female presenting with flulike symptoms.  Differential diagnosis includes but is not limited to oral process, particularly COVID-19, influenza, mononucleosis; community-acquired pneumonia, UTI, etc.  Will obtain sepsis work-up including lactic acid and procalcitonin, check mono, respiratory panel, chest x-ray.  Will initiate IV fluid resuscitation, IV Toradol for myalgias and IV Zofran for nausea.  Will reassess.  Clinical Course as of 05/24/20 0656  Thu May 24, 2020  6195 Lactic acid unremarkable.  Patient resting in no acute distress.  Tachycardia improved. [JS]  0422 Procalcitonin negative per algorithm. [JS]  0516 Leukocyte positive UTI and ketonuria.  Will infuse additional fluids, start IV Rocephin.  Patient is feeling significantly better.  Currently resting in no acute distress. [JS]    Clinical Course User Index [JS] Irean Hong, MD     ____________________________________________   FINAL CLINICAL IMPRESSION(S) / ED DIAGNOSES  Final diagnoses:  Viral illness  Urinary tract infection without hematuria, site unspecified  Dehydration     ED Discharge Orders         Ordered    cephALEXin (KEFLEX) 500 MG capsule  3 times daily        05/24/20 0533    ondansetron (ZOFRAN ODT) 4 MG disintegrating tablet  Every 8 hours PRN        05/24/20 0533          *Please note:  Vianca Bracher was evaluated in Emergency Department on 05/24/2020 for the symptoms described in the history of present  illness. She was evaluated in the context of the global COVID-19 pandemic, which necessitated consideration that the patient might be at risk for infection with the SARS-CoV-2 virus that causes COVID-19. Institutional protocols and algorithms that pertain to the evaluation of patients at risk for COVID-19 are in a state of rapid change based on information released by regulatory bodies including the CDC and federal and state organizations. These policies and algorithms were followed during the patient's care in the ED.  Some ED evaluations and interventions may be delayed as a result  of limited staffing during and the pandemic.*   Note:  This document was prepared using Dragon voice recognition software and may include unintentional dictation errors.   Irean Hong, MD 05/24/20 [id number]

## 2020-05-24 NOTE — ED Triage Notes (Signed)
Patient ambulatory to triage with steady gait, without difficulty or distress noted; COVID and flu swab negative at urgent care today; rx tamiflu but fever & congestion persists with chills and generalized pain with HA; nyquil with acetaminophen taken at midnight

## 2020-05-25 LAB — URINE CULTURE: Culture: <10000 — AB

## 2020-05-29 LAB — CULTURE, BLOOD (ROUTINE X 2)
Culture: NO GROWTH
Culture: NO GROWTH
Special Requests: ADEQUATE

## 2020-12-19 ENCOUNTER — Ambulatory Visit
Admission: EM | Admit: 2020-12-19 | Discharge: 2020-12-19 | Disposition: A | Discharge status: Home / Self Care | Payer: BC Managed Care – PPO | Attending: Emergency Medicine | Admitting: Emergency Medicine

## 2020-12-19 ENCOUNTER — Other Ambulatory Visit: Payer: Self-pay

## 2020-12-19 ENCOUNTER — Encounter: Payer: Self-pay | Admitting: Emergency Medicine

## 2020-12-19 ENCOUNTER — Emergency Department
Admission: EM | Admit: 2020-12-19 | Discharge: 2020-12-19 | Disposition: A | Discharge status: Home / Self Care | Payer: BC Managed Care – PPO | Attending: Emergency Medicine | Admitting: Emergency Medicine

## 2020-12-19 DIAGNOSIS — Z5321 Procedure and treatment not carried out due to patient leaving prior to being seen by health care provider: Secondary | ICD-10-CM | POA: Insufficient documentation

## 2020-12-19 DIAGNOSIS — H109 Unspecified conjunctivitis: Secondary | ICD-10-CM | POA: Diagnosis not present

## 2020-12-19 DIAGNOSIS — H5789 Other specified disorders of eye and adnexa: Secondary | ICD-10-CM | POA: Diagnosis present

## 2020-12-19 MED ORDER — POLYMYXIN B-TRIMETHOPRIM 10000-0.1 UNIT/ML-% OP SOLN: 1.0000 [drp] | Freq: Four times a day (QID) | OPHTHALMIC | 0 refills | Status: AC

## 2020-12-19 NOTE — Discharge Instructions (Addendum)
Use the antibiotic eyedrops as prescribed.    Follow-up with your eye doctor for a recheck in 1 to 2 days if your symptoms are not improving.    Go to the emergency department if you have acute eye pain, changes in your vision, or other concerning symptoms.    

## 2020-12-19 NOTE — ED Triage Notes (Signed)
Pt to ED for right eye irritation that started last night. Redness noted. No drainage.

## 2020-12-19 NOTE — ED Provider Notes (Signed)
Tara Daugherty    CSN: [id number] Arrival date & time: 12/19/20  1226      History   Chief Complaint Chief Complaint  Patient presents with   Eye Problem    HPI Tara Daugherty is a 20 y.o. female.  Patient presents with right eye discomfort and redness since yesterday.  No trauma or injury.  No eye drainage or changes in her vision.  She denies fever, chills, or other symptoms.  No treatments attempted at home.  Her medical history includes anxiety.  Patient usually wears glasses but does not have them with her today.  The history is provided by the patient and medical records.   Past Medical History:  Diagnosis Date   Anxiety     There are no problems to display for this patient.   Past Surgical History:  Procedure Laterality Date   BACK SURGERY     back injections   HAND SURGERY Right    HIP SURGERY     JOINT REPLACEMENT     TONSILLECTOMY      OB History   No obstetric history on file.      Home Medications    Prior to Admission medications   Medication Sig Start Date End Date Taking? Authorizing Provider  ALPRAZolam Prudy Feeler) 0.5 MG tablet Take 1 tablet (0.5 mg total) by mouth at bedtime as needed for anxiety. 11/16/17  Yes Irean Hong, MD  desvenlafaxine (PRISTIQ) 100 MG 24 hr tablet TK 1 T PO D 02/17/18  Yes [provider]  drospirenone-ethinyl estradiol (YASMIN) 3-0.03 MG tablet Take 1 tablet by mouth daily.   Yes [provider]  hydrOXYzine (ATARAX/VISTARIL) 50 MG tablet TAKE 1 TABLET BY MOUTH 3 TO 4 TIMES DAILY AS NEEDED 02/22/19  Yes [provider]  spironolactone (ALDACTONE) 25 MG tablet  05/02/19  Yes [provider]  traZODone (DESYREL) 50 MG tablet  05/02/19  Yes [provider]  trimethoprim-polymyxin b (POLYTRIM) ophthalmic solution Place 1 drop into both eyes 4 (four) times daily for 7 days. 12/19/20 12/26/20 Yes Mickie Bail, NP  albuterol (VENTOLIN HFA) 108 (90 Base) MCG/ACT inhaler Inhale 2  puffs into the lungs every 4 (four) hours as needed for wheezing or shortness of breath. 12/23/19   Moshe Cipro, NP  benzonatate (TESSALON) 100 MG capsule Take 1 capsule (100 mg total) by mouth every 8 (eight) hours. 12/23/19   Moshe Cipro, NP  cephALEXin (KEFLEX) 500 MG capsule Take 1 capsule (500 mg total) by mouth 3 (three) times daily. 05/24/20   Irean Hong, MD  drospirenone-ethinyl estradiol (YAZ) 3-0.02 MG tablet Take by mouth. 02/18/19   [provider]  fluticasone (FLONASE) 50 MCG/ACT nasal spray Place 2 sprays into both nostrils daily. 10/24/17   Menshew, Charlesetta Ivory, PA-C  gabapentin (NEURONTIN) 300 MG capsule Take 300 mg by mouth at bedtime.     [provider]  ondansetron (ZOFRAN ODT) 4 MG disintegrating tablet Take 1 tablet (4 mg total) by mouth every 8 (eight) hours as needed for nausea or vomiting. 05/24/20   Irean Hong, MD  triamcinolone (KENALOG) 0.1 % Apply 1 application topically 2 (two) times daily. 04/15/20   Georgetta Haber, NP    Family History Family History  Problem Relation Age of Onset   Healthy Mother    Healthy Father     Social History Social History   Tobacco Use   Smoking status: Former    Types: E-cigarettes   Smokeless  tobacco: Never  Vaping Use   Vaping Use: Every day  Substance Use Topics   Alcohol use: Yes   Drug use: Yes    Types: Marijuana    Comment: Rarely     Allergies   Patient has no known allergies.   Review of Systems Review of Systems  Constitutional:  Negative for chills and fever.  HENT:  Negative for congestion, ear pain and sore throat.   Eyes:  Positive for pain and redness. Negative for photophobia, discharge, itching and visual disturbance.  Respiratory:  Negative for cough and shortness of breath.   Cardiovascular:  Negative for chest pain and palpitations.  Skin:  Negative for color change and rash.  All other systems reviewed and are negative.   Physical Exam Triage Vital  Signs ED Triage Vitals  Enc Vitals Group     BP      Pulse      Resp      Temp      Temp src      SpO2      Weight      Height      Head Circumference      Peak Flow      Pain Score      Pain Loc      Pain Edu?      Excl. in [place]?    No data found.  Updated Vital Signs BP 106/71 (BP Location: Left Arm)   Pulse 95   Temp 98.1 F (36.7 C) (Oral)   LMP 12/05/2020 (Approximate)   SpO2 99%   Visual Acuity Right Eye Distance: 20/100 (Pt did not have on her glassess) Left Eye Distance: 20/30 (Pt did not have on her glassess) Bilateral Distance: 20/40 (Pt did not have on her glassess)  Right Eye Near:   Left Eye Near:    Bilateral Near:     Physical Exam Vitals and nursing note reviewed.  Constitutional:      General: She is not in acute distress.    Appearance: She is well-developed. She is not ill-appearing.  HENT:     Head: Normocephalic and atraumatic.     Mouth/Throat:     Mouth: Mucous membranes are moist.  Eyes:     General: Lids are normal.     Extraocular Movements: Extraocular movements intact.     Conjunctiva/sclera:     Right eye: Right conjunctiva is injected.     Pupils: Pupils are equal, round, and reactive to light.  Cardiovascular:     Rate and Rhythm: Normal rate and regular rhythm.     Heart sounds: Normal heart sounds.  Pulmonary:     Effort: Pulmonary effort is normal. No respiratory distress.     Breath sounds: Normal breath sounds.  Abdominal:     Palpations: Abdomen is soft.     Tenderness: There is no abdominal tenderness.  Musculoskeletal:     Cervical back: Neck supple.  Skin:    General: Skin is warm and dry.  Neurological:     Mental Status: She is alert.  Psychiatric:        Mood and Affect: Mood normal.        Behavior: Behavior normal.     UC Treatments / Results  Labs (all labs ordered are listed, but only abnormal results are displayed) Labs Reviewed - No data to display  EKG   Radiology No results  found.  Procedures Procedures (including critical care time)  Medications Ordered in UC Medications -  No data to display  Initial Impression / Assessment and Plan / UC Course  I have reviewed the triage vital signs and the nursing notes.  Pertinent labs & imaging results that were available during my care of the patient were reviewed by me and considered in my medical decision making (see chart for details).  Conjunctivitis of right eye.  Patient declined transfer to the ED at this time.  Treating with Polytrim eyedrops.  Instructed her to follow-up with her eye care provider in the next 1 to 2 days if her symptoms or not improving.  Strict ED precautions discussed.  Patient agrees to plan of care.   Final Clinical Impressions(s) / UC Diagnoses   Final diagnoses:  Conjunctivitis of right eye, unspecified conjunctivitis type     Discharge Instructions      Use the antibiotic eyedrops as prescribed.    Follow-up with your eye doctor for a recheck in 1 to 2 days if your symptoms are not improving.    Go to the emergency department if you have acute eye pain, changes in your vision, or other concerning symptoms.        ED Prescriptions     Medication Sig Dispense Auth. Provider   trimethoprim-polymyxin b (POLYTRIM) ophthalmic solution Place 1 drop into both eyes 4 (four) times daily for 7 days. 10 mL Sharion Balloon, NP      PDMP not reviewed this encounter.   Sharion Balloon, NP 12/19/20 1442

## 2020-12-19 NOTE — ED Notes (Signed)
Pt left facility and informed registration.  Pt to be taken off the board.

## 2020-12-19 NOTE — ED Triage Notes (Signed)
Pt c/o right ear irritation and redness sxs started yesterday.

## 2021-05-28 IMAGING — DX DG HAND COMPLETE 3+V*R*
3 series · 3 of 3 positions shown · non-contrast
Comparison: None.

CLINICAL DATA: Right hand foreign body

EXAM:
RIGHT HAND - COMPLETE 3+ VIEW

[hand ap]
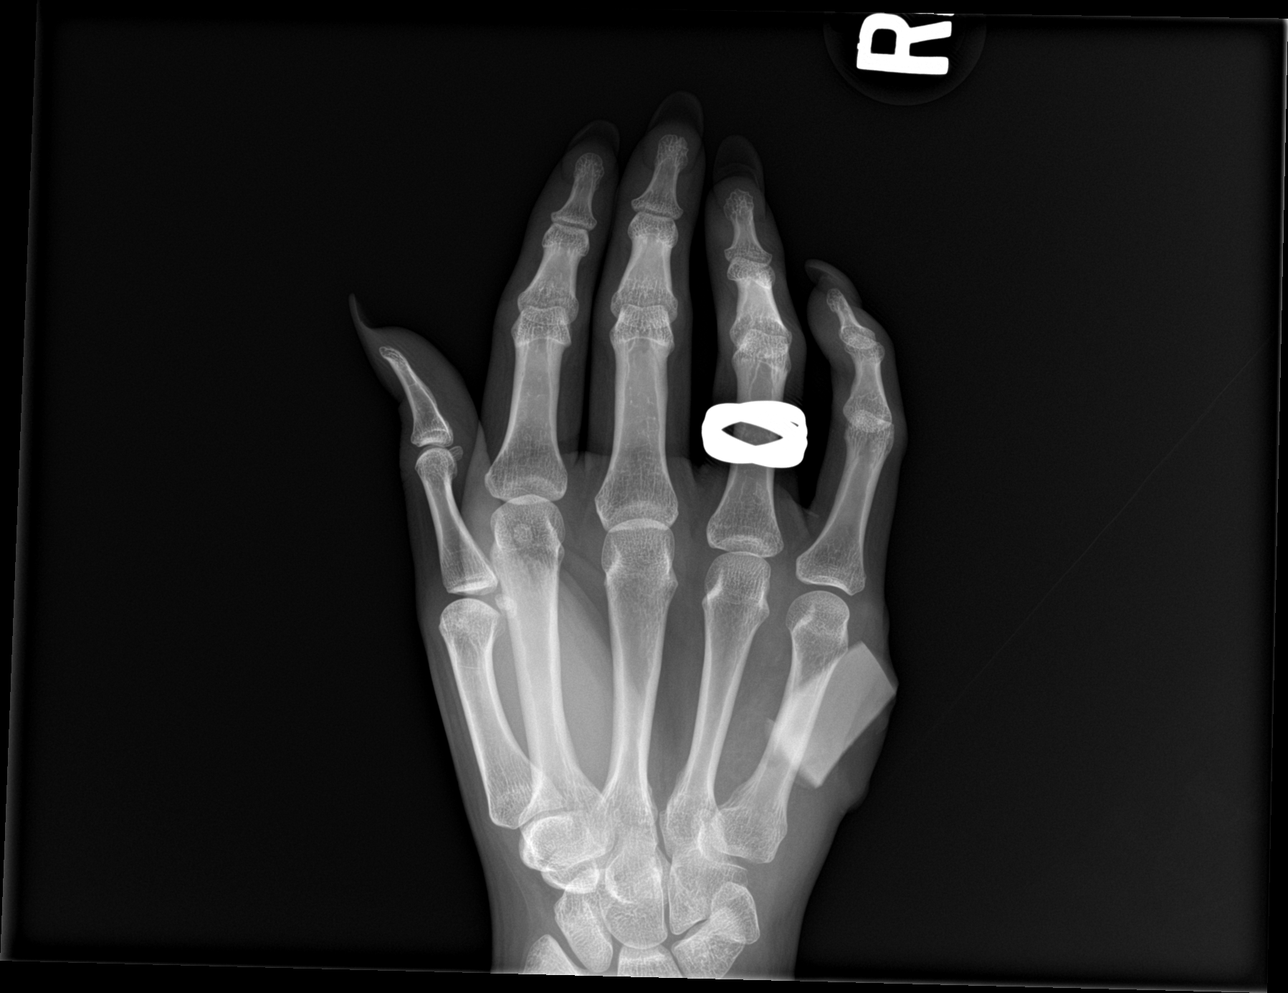

[hand obl]
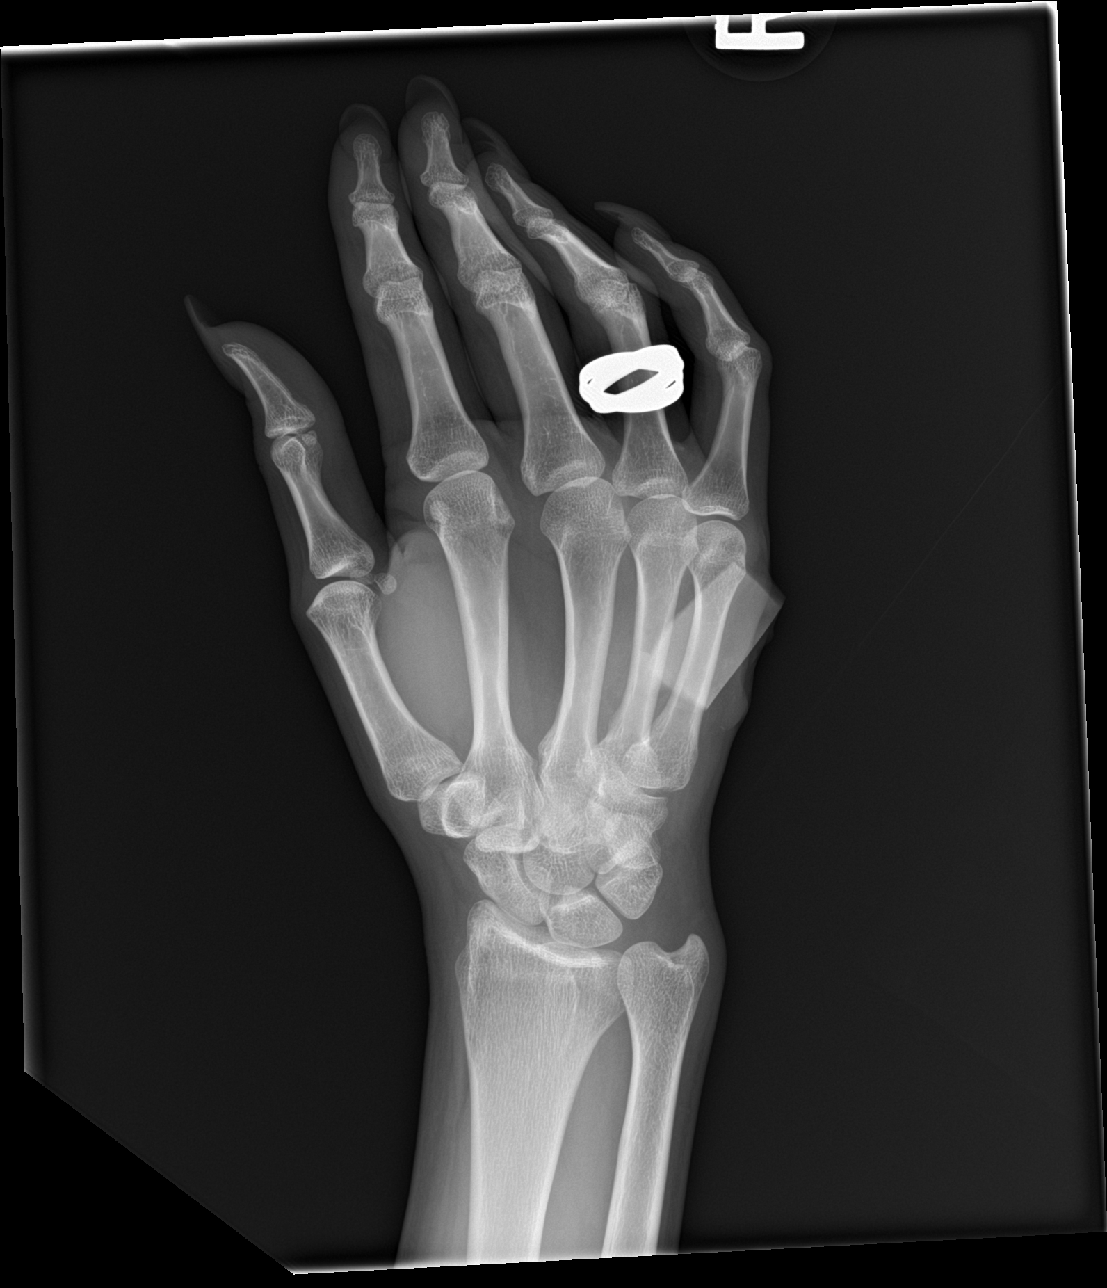

[hand lat]
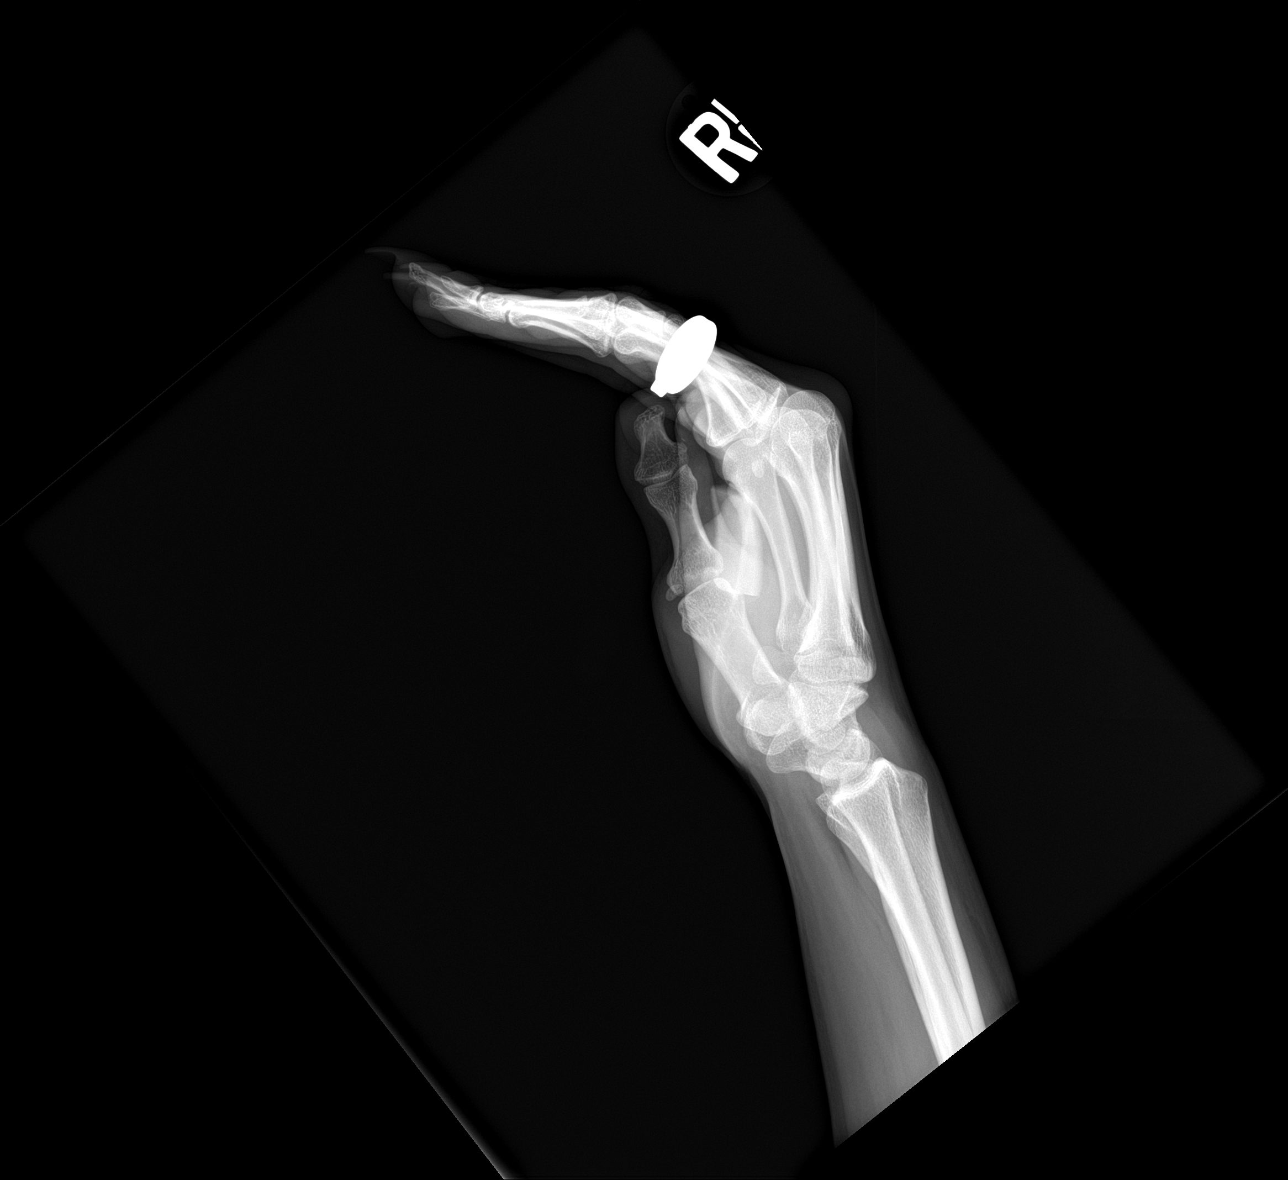

[3 of 3 positions shown; findings below may reference images not displayed]

FINDINGS: Three view radiograph right hand demonstrates a retained radiodense
rectangular foreign body within the soft tissues ventral and lateral
to the mid to distal diaphysis of the fifth metacarpal measuring
x 2.4 cm in greatest dimension. Overlying soft tissue deformity in
keeping with soft tissue laceration. No superimposed acute fracture
or dislocation identified.
IMPRESSION: 2.4 cm retained radiopaque rectangular foreign body within the
lateral soft tissues of the right hand.

## 2021-08-19 IMAGING — CR DG CHEST 2V
1 series · 2 of 2 positions shown · non-contrast
Comparison: 12/23/2019

CLINICAL DATA: Fever

EXAM:
CHEST - 2 VIEW

[Series 1: dg chest 2 view · 0.14mm/px · 2 of 2 slices shown]
[im 1/2]
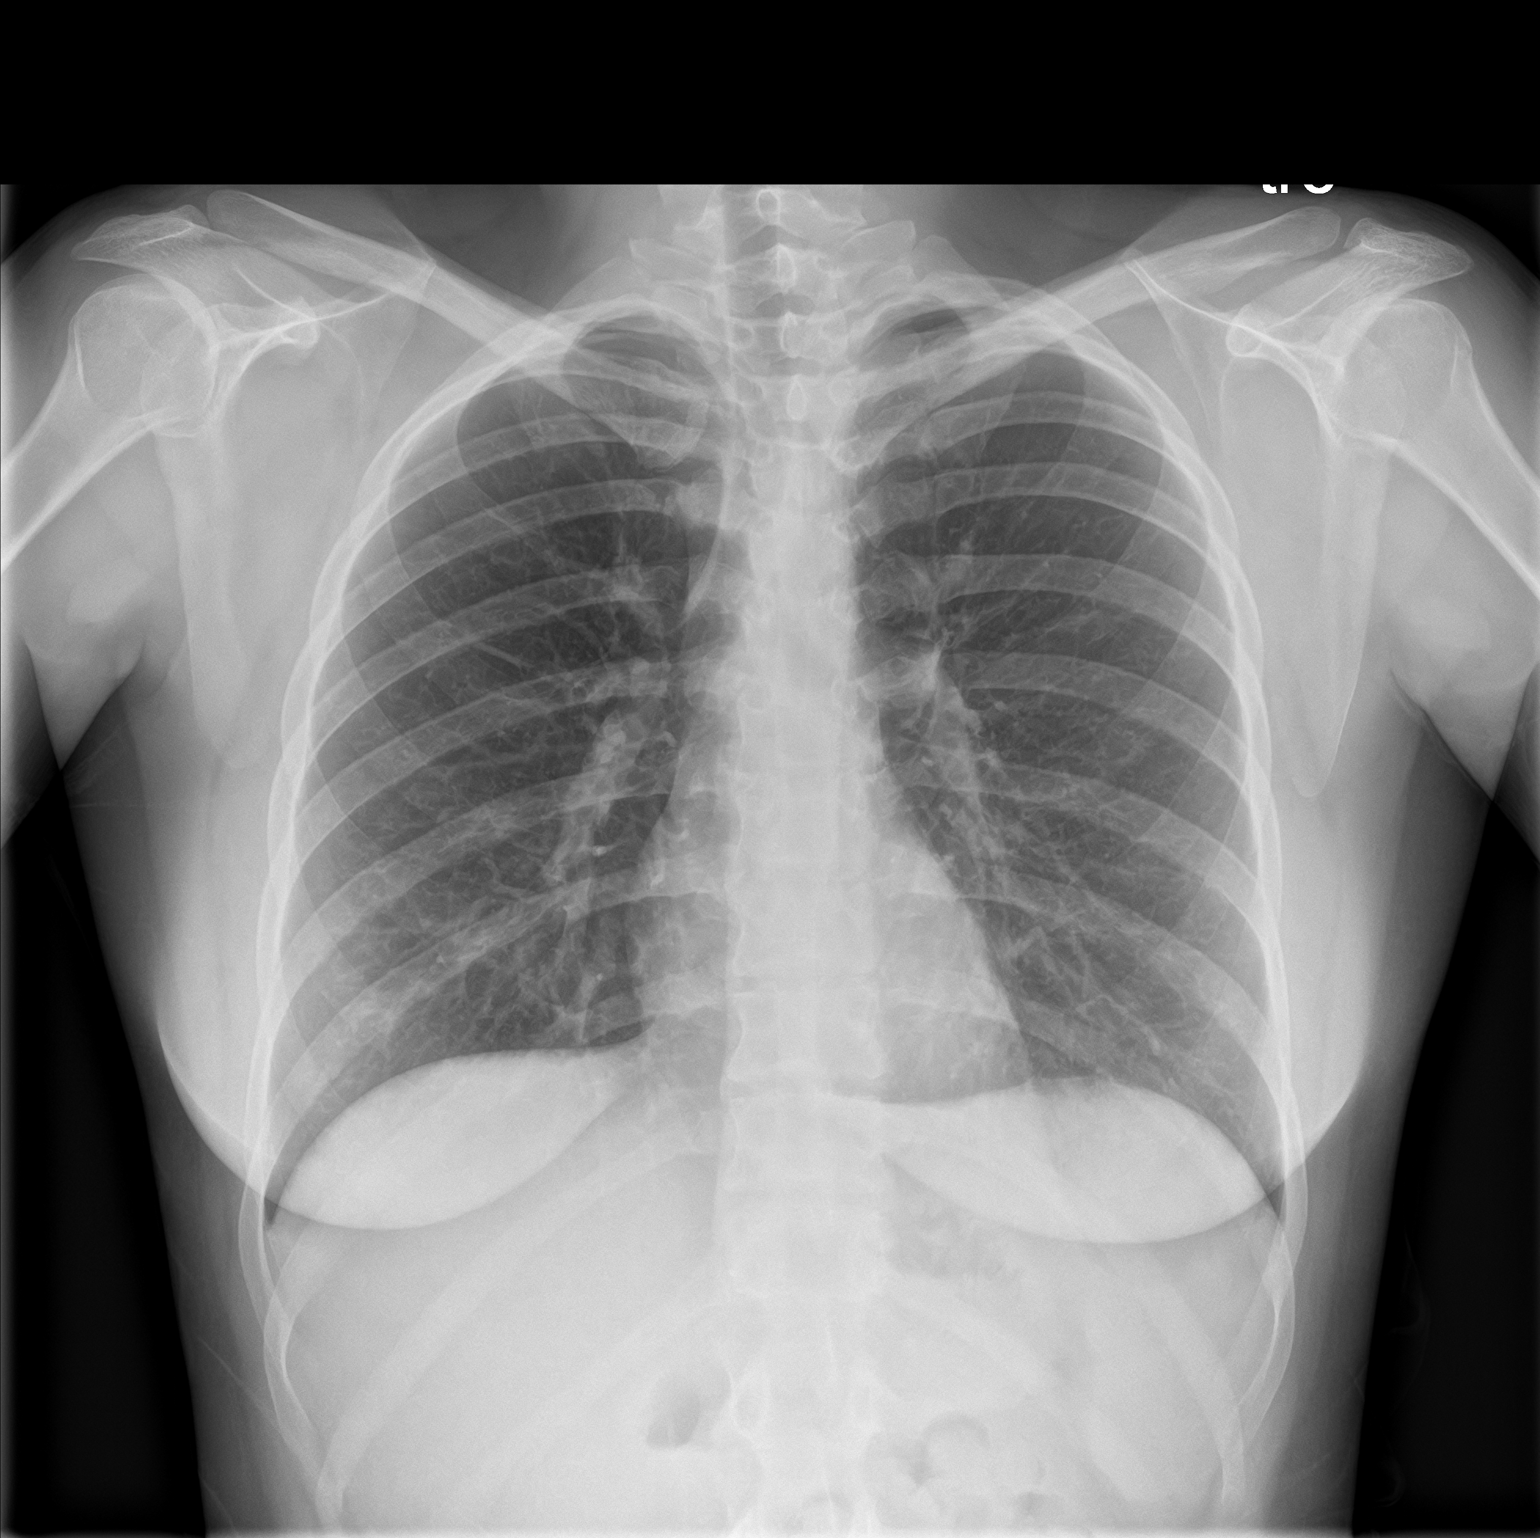
[im 2/2]
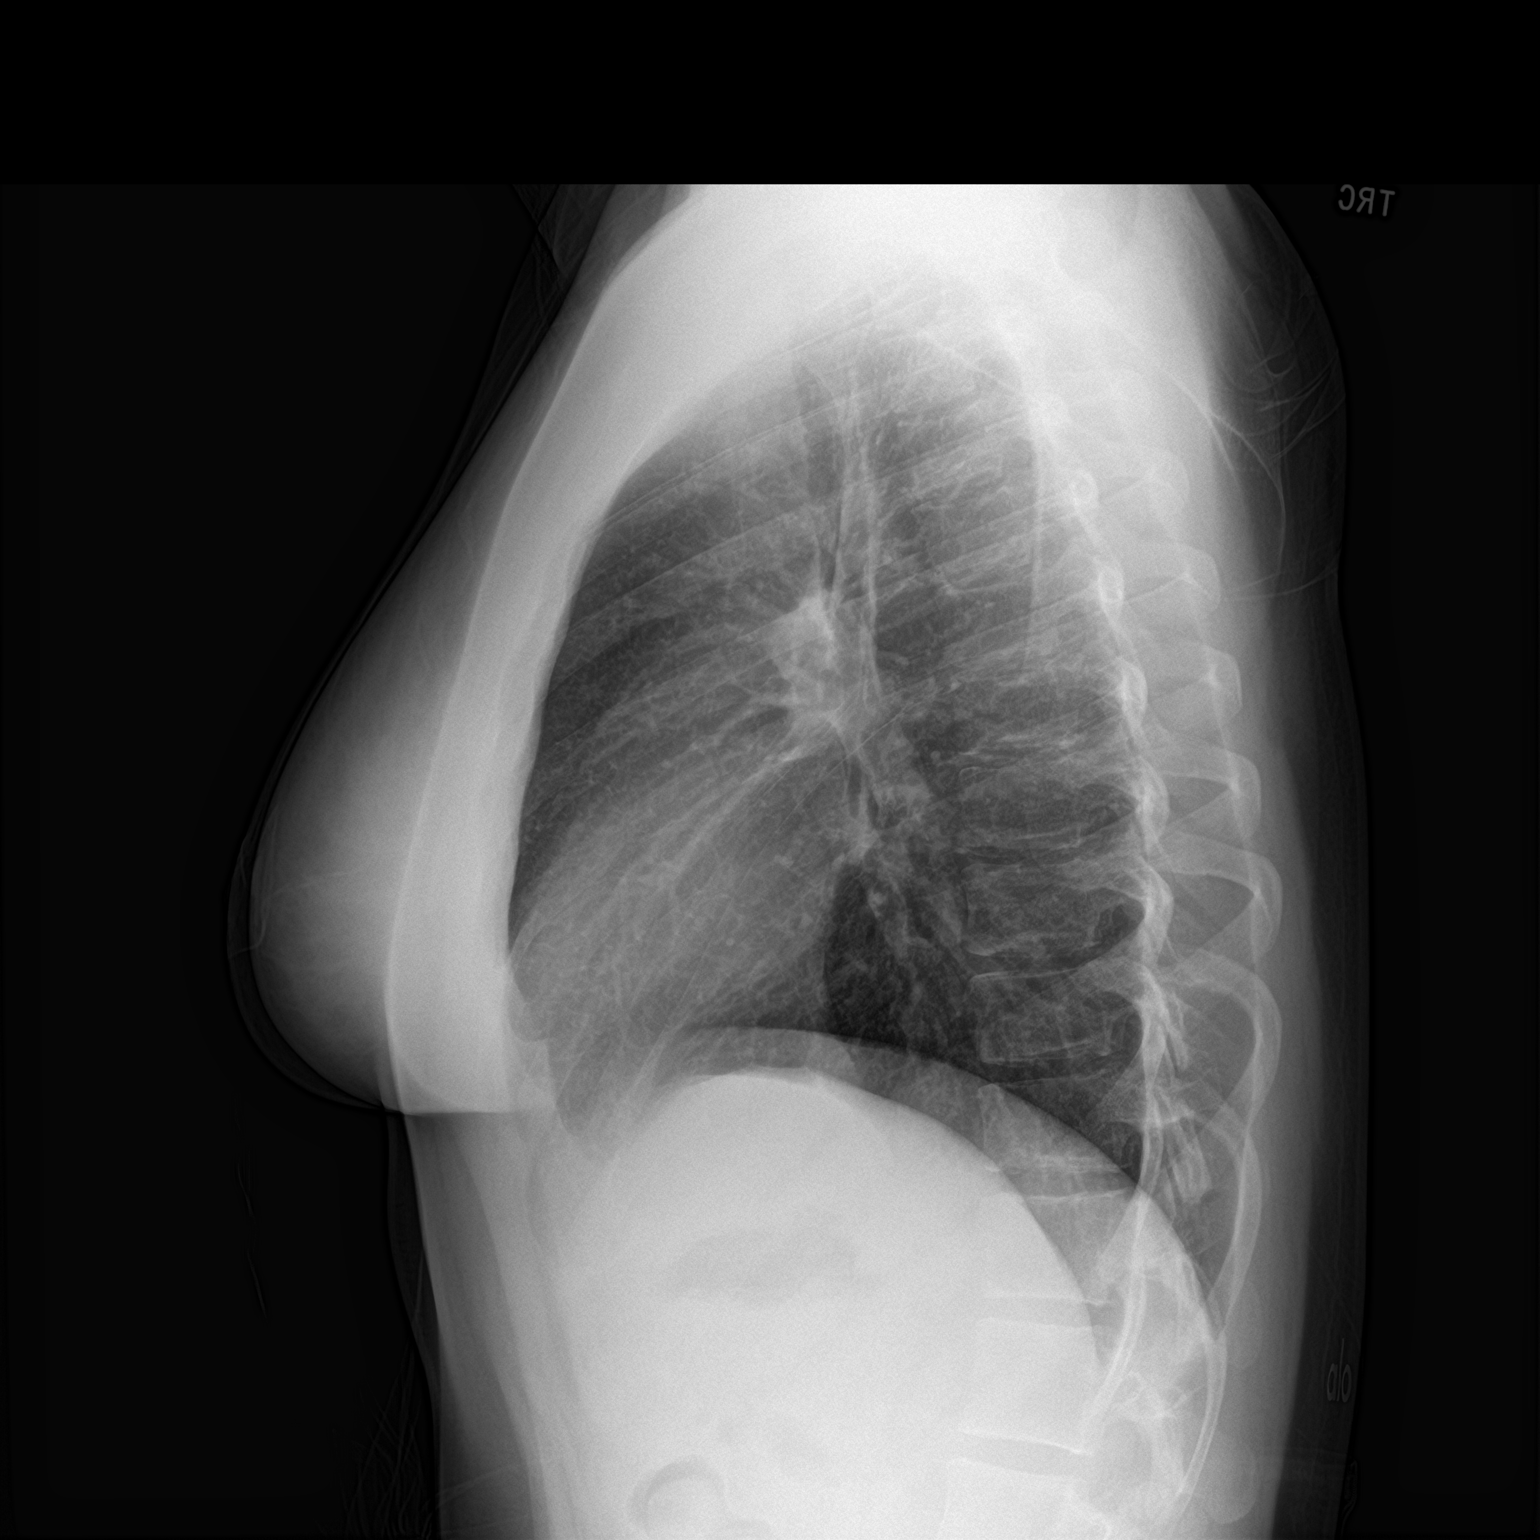

[2 of 2 positions shown; findings below may reference images not displayed]

FINDINGS: The heart size and mediastinal contours are within normal limits.
Both lungs are clear. The visualized skeletal structures are
unremarkable.
IMPRESSION: No active cardiopulmonary disease.

## 2022-01-12 ENCOUNTER — Other Ambulatory Visit: Payer: Self-pay

## 2022-01-12 ENCOUNTER — Emergency Department
Admission: EM | Admit: 2022-01-12 | Discharge: 2022-01-12 | Disposition: A | Discharge status: Home / Self Care | Payer: BC Managed Care – PPO | Attending: Emergency Medicine | Admitting: Emergency Medicine

## 2022-01-12 DIAGNOSIS — F419 Anxiety disorder, unspecified: Secondary | ICD-10-CM | POA: Diagnosis not present

## 2022-01-12 DIAGNOSIS — Z760 Encounter for issue of repeat prescription: Secondary | ICD-10-CM | POA: Diagnosis not present

## 2022-01-12 MED ORDER — DESVENLAFAXINE SUCCINATE ER 100 MG PO TB24: 100.0000 mg | ORAL_TABLET | Freq: Every day | ORAL | 0 refills | Status: DC

## 2022-01-12 MED ORDER — DESVENLAFAXINE SUCCINATE ER 100 MG PO TB24: 100.0000 mg | ORAL_TABLET | Freq: Every day | ORAL | 0 refills | Status: AC

## 2022-01-12 NOTE — Discharge Instructions (Addendum)
Take the medicine as prescribed. Follow-up with your provider for continued care.

## 2022-01-12 NOTE — ED Triage Notes (Signed)
PT sts she ran out of her medication "pristiq" and was not able to take a dose last night.

## 2022-01-12 NOTE — ED Provider Notes (Signed)
[facility] Emergency Department Provider Note     Event Date/Time   First MD Initiated Contact with Patient 01/12/22 2039     (approximate)   History   Medication Refill   HPI  Tara Daugherty is a 21 y.o. female presents to the ED with request for prescription refill.  Patient apparently ran out of her Pristiq, and did not receive a phone call from her provider when she called today.  She took her last tablet last night.  She denies any SI or HI.  She does endorse some anxiety at this time.     Physical Exam   Triage Vital Signs: ED Triage Vitals  Enc Vitals Group     BP 01/12/22 2022 123/81     Pulse Rate 01/12/22 2022 98     Resp 01/12/22 2022 16     Temp 01/12/22 2022 98.4 F (36.9 C)     Temp src --      SpO2 01/12/22 2022 100 %     Weight 01/12/22 2023 141 lb 1.5 oz (64 kg)     Height 01/12/22 2023 5\' 7"  (1.702 m)     Head Circumference --      Peak Flow --      Pain Score 01/12/22 2023 0     Pain Loc --      Pain Edu? --      Excl. in [place]? --     Most recent vital signs: Vitals:   01/12/22 2022  BP: 123/81  Pulse: 98  Resp: 16  Temp: 98.4 F (36.9 C)  SpO2: 100%    General Awake, no distress. NAD CV:  Good peripheral perfusion.  RESP:  Normal effort.  ABD:  No distention.    ED Results / Procedures / Treatments   Labs (all labs ordered are listed, but only abnormal results are displayed) Labs Reviewed - No data to display   EKG   RADIOLOGY  No results found.   PROCEDURES:  Critical Care performed: No  Procedures   MEDICATIONS ORDERED IN ED: Medications - No data to display   IMPRESSION / MDM / ASSESSMENT AND PLAN / ED COURSE  I reviewed the triage vital signs and the nursing notes.                              Differential diagnosis includes, but is not limited to, anxiety, med refill request, acute psychosis   Patient's presentation is most consistent with acute, uncomplicated  illness.  Patient to the ED for request of medicine.  Patient presents in no acute distress without any reports of SI or HI.  Patient's diagnosis is consistent with anxiety. Patient will be discharged home with prescriptions for Pristiq. Patient is to follow up with her primary provider or counselor as needed or otherwise directed. Patient is given ED precautions to return to the ED for any worsening or new symptoms.     FINAL CLINICAL IMPRESSION(S) / ED DIAGNOSES   Final diagnoses:  Medication refill     Rx / DC Orders   ED Discharge Orders          Ordered    desvenlafaxine (PRISTIQ) 100 MG 24 hr tablet  Daily,   Status:  Discontinued        01/12/22 2109    desvenlafaxine (PRISTIQ) 100 MG 24 hr tablet  Daily        01/12/22 2117  Note:  This document was prepared using Dragon voice recognition software and may include unintentional dictation errors.    Lissa Hoard, PA-C 01/13/22 0013    Phineas Semen, MD 01/17/22 1504
